# Patient Record
Sex: Female | Born: 1960
Health system: Southern US, Community
[De-identification: ages and names within clinical notes are randomized; demographics above are authoritative.]

## PROBLEM LIST (undated history)

## (undated) DIAGNOSIS — R87619 Unspecified abnormal cytological findings in specimens from cervix uteri: Secondary | ICD-10-CM

## (undated) DIAGNOSIS — A64 Unspecified sexually transmitted disease: Secondary | ICD-10-CM

## (undated) DIAGNOSIS — IMO0002 Reserved for concepts with insufficient information to code with codable children: Secondary | ICD-10-CM

## (undated) DIAGNOSIS — E039 Hypothyroidism, unspecified: Secondary | ICD-10-CM

## (undated) DIAGNOSIS — M1990 Unspecified osteoarthritis, unspecified site: Secondary | ICD-10-CM

## (undated) DIAGNOSIS — E785 Hyperlipidemia, unspecified: Secondary | ICD-10-CM

## (undated) HISTORY — DX: Unspecified abnormal cytological findings in specimens from cervix uteri: R87.619

## (undated) HISTORY — DX: Hyperlipidemia, unspecified: E78.5

## (undated) HISTORY — DX: Hypothyroidism, unspecified: E03.9

## (undated) HISTORY — DX: Unspecified sexually transmitted disease: A64

## (undated) HISTORY — DX: Reserved for concepts with insufficient information to code with codable children: IMO0002

## (undated) HISTORY — PX: COLONOSCOPY: SHX174

---

## 1998-01-13 ENCOUNTER — Encounter: Admission: RE | Admit: 1998-01-13 | Discharge: 1998-04-13 | Payer: Self-pay | Admitting: Family Medicine

## 2002-12-10 ENCOUNTER — Other Ambulatory Visit: Admission: RE | Admit: 2002-12-10 | Discharge: 2002-12-10 | Payer: Self-pay | Admitting: Obstetrics and Gynecology

## 2003-02-04 ENCOUNTER — Encounter: Payer: Self-pay | Admitting: Family Medicine

## 2003-02-04 ENCOUNTER — Ambulatory Visit (HOSPITAL_COMMUNITY): Admission: RE | Admit: 2003-02-04 | Discharge: 2003-02-04 | Payer: Self-pay | Admitting: Family Medicine

## 2003-02-13 ENCOUNTER — Ambulatory Visit (HOSPITAL_BASED_OUTPATIENT_CLINIC_OR_DEPARTMENT_OTHER): Admission: RE | Admit: 2003-02-13 | Discharge: 2003-02-13 | Payer: Self-pay | Admitting: Family Medicine

## 2003-06-10 ENCOUNTER — Encounter: Admission: RE | Admit: 2003-06-10 | Discharge: 2003-09-08 | Payer: Self-pay | Admitting: Family Medicine

## 2003-06-11 ENCOUNTER — Encounter: Admission: RE | Admit: 2003-06-11 | Discharge: 2003-06-11 | Payer: Self-pay | Admitting: General Surgery

## 2003-06-25 ENCOUNTER — Encounter: Admission: RE | Admit: 2003-06-25 | Discharge: 2003-06-25 | Payer: Self-pay | Admitting: General Surgery

## 2003-07-01 ENCOUNTER — Ambulatory Visit (HOSPITAL_COMMUNITY): Admission: RE | Admit: 2003-07-01 | Discharge: 2003-07-01 | Payer: Self-pay | Admitting: General Surgery

## 2003-07-01 ENCOUNTER — Encounter (INDEPENDENT_AMBULATORY_CARE_PROVIDER_SITE_OTHER): Payer: Self-pay | Admitting: Cardiology

## 2003-08-31 HISTORY — PX: GASTRIC BYPASS: SHX52

## 2003-09-16 ENCOUNTER — Inpatient Hospital Stay (HOSPITAL_COMMUNITY): Admission: RE | Admit: 2003-09-16 | Discharge: 2003-09-19 | Payer: Self-pay | Admitting: General Surgery

## 2003-09-22 ENCOUNTER — Encounter: Admission: RE | Admit: 2003-09-22 | Discharge: 2003-12-21 | Payer: Self-pay | Admitting: Family Medicine

## 2004-02-05 ENCOUNTER — Other Ambulatory Visit: Admission: RE | Admit: 2004-02-05 | Discharge: 2004-02-05 | Payer: Self-pay | Admitting: Family Medicine

## 2004-03-30 ENCOUNTER — Encounter: Admission: RE | Admit: 2004-03-30 | Discharge: 2004-03-30 | Payer: Self-pay | Admitting: Family Medicine

## 2004-06-07 ENCOUNTER — Other Ambulatory Visit: Admission: RE | Admit: 2004-06-07 | Discharge: 2004-06-07 | Payer: Self-pay | Admitting: Obstetrics and Gynecology

## 2004-08-02 ENCOUNTER — Encounter: Admission: RE | Admit: 2004-08-02 | Discharge: 2004-10-31 | Payer: Self-pay | Admitting: Family Medicine

## 2004-09-13 ENCOUNTER — Other Ambulatory Visit: Admission: RE | Admit: 2004-09-13 | Discharge: 2004-09-13 | Payer: Self-pay | Admitting: Obstetrics and Gynecology

## 2005-01-21 ENCOUNTER — Other Ambulatory Visit: Admission: RE | Admit: 2005-01-21 | Discharge: 2005-01-21 | Payer: Self-pay | Admitting: Obstetrics and Gynecology

## 2005-12-30 ENCOUNTER — Other Ambulatory Visit: Admission: RE | Admit: 2005-12-30 | Discharge: 2005-12-30 | Payer: Self-pay | Admitting: Obstetrics & Gynecology

## 2006-03-13 ENCOUNTER — Other Ambulatory Visit: Admission: RE | Admit: 2006-03-13 | Discharge: 2006-03-13 | Payer: Self-pay | Admitting: Obstetrics & Gynecology

## 2007-03-16 ENCOUNTER — Other Ambulatory Visit: Admission: RE | Admit: 2007-03-16 | Discharge: 2007-03-16 | Payer: Self-pay | Admitting: Obstetrics and Gynecology

## 2007-05-30 ENCOUNTER — Encounter: Admission: RE | Admit: 2007-05-30 | Discharge: 2007-05-30 | Payer: Self-pay | Admitting: Endocrinology

## 2007-06-06 ENCOUNTER — Encounter (INDEPENDENT_AMBULATORY_CARE_PROVIDER_SITE_OTHER): Payer: Self-pay | Admitting: Interventional Radiology

## 2007-06-06 ENCOUNTER — Other Ambulatory Visit: Admission: RE | Admit: 2007-06-06 | Discharge: 2007-06-06 | Payer: Self-pay | Admitting: Interventional Radiology

## 2007-06-06 ENCOUNTER — Encounter: Admission: RE | Admit: 2007-06-06 | Discharge: 2007-06-06 | Payer: Self-pay | Admitting: Endocrinology

## 2008-02-20 ENCOUNTER — Other Ambulatory Visit: Admission: RE | Admit: 2008-02-20 | Discharge: 2008-02-20 | Payer: Self-pay | Admitting: Obstetrics and Gynecology

## 2008-03-24 ENCOUNTER — Other Ambulatory Visit: Admission: RE | Admit: 2008-03-24 | Discharge: 2008-03-24 | Payer: Self-pay | Admitting: Obstetrics & Gynecology

## 2008-04-15 ENCOUNTER — Encounter: Admission: RE | Admit: 2008-04-15 | Discharge: 2008-04-15 | Payer: Self-pay | Admitting: Endocrinology

## 2009-12-06 IMAGING — US US SOFT TISSUE HEAD/NECK
1 series · 14 of 24 positions shown · non-contrast
Comparison: None.

THYROID ULTRASOUND:

CLINICAL DATA: Goiter on physical exam
TECHNIQUE: Ultrasound examination of the thyroid gland and adjacent soft tissue
structures was performed.

[Series 1: unknown · 0.09mm/px · 14 of 24 slices shown]
[im 1/24]
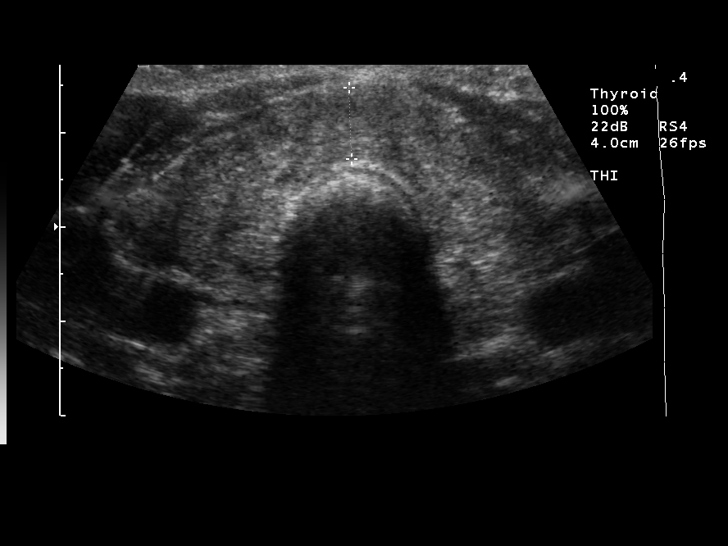
[im 3/24]
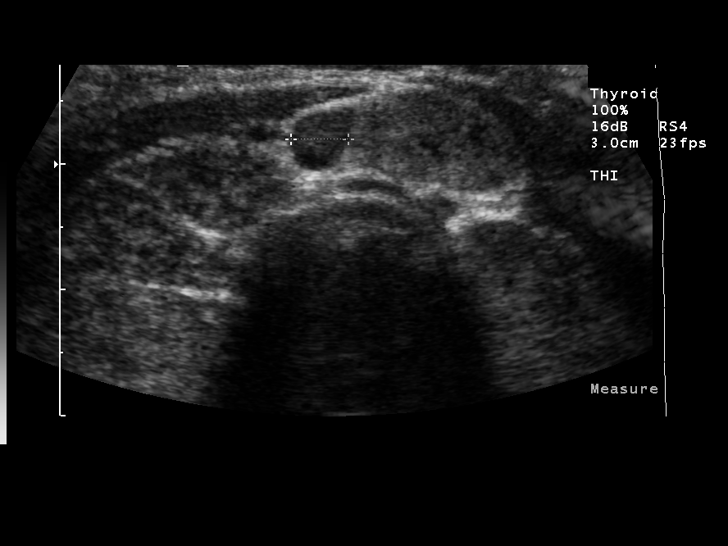
[im 5/24]
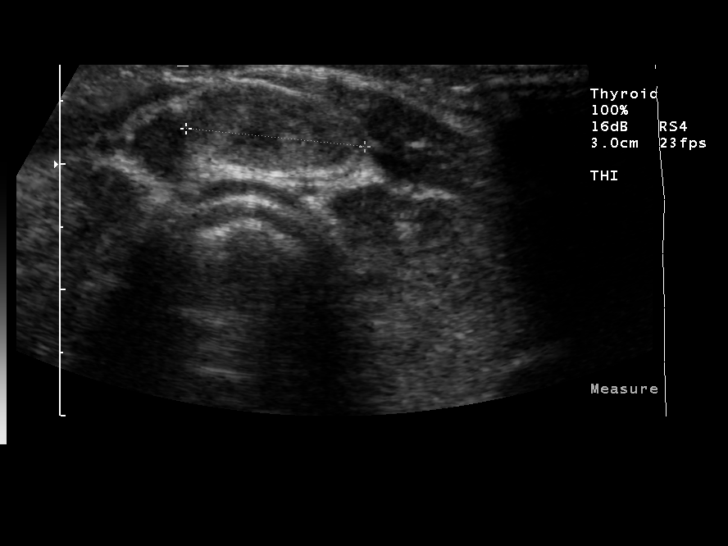
[im 7/24]
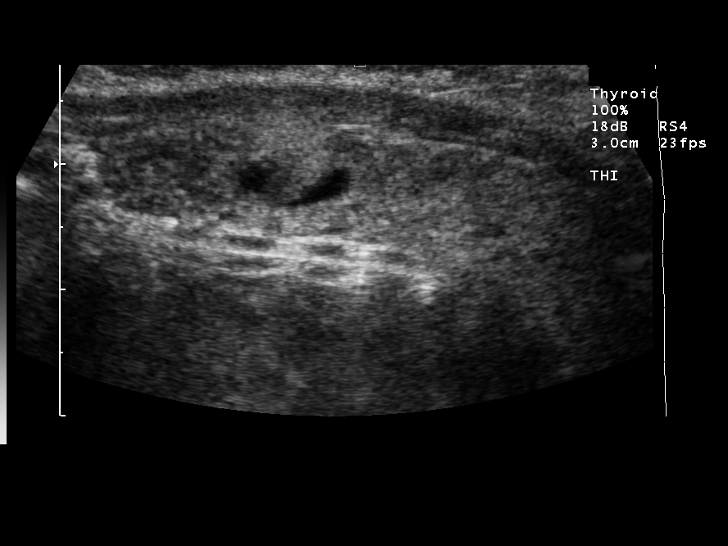
[im 8/24]
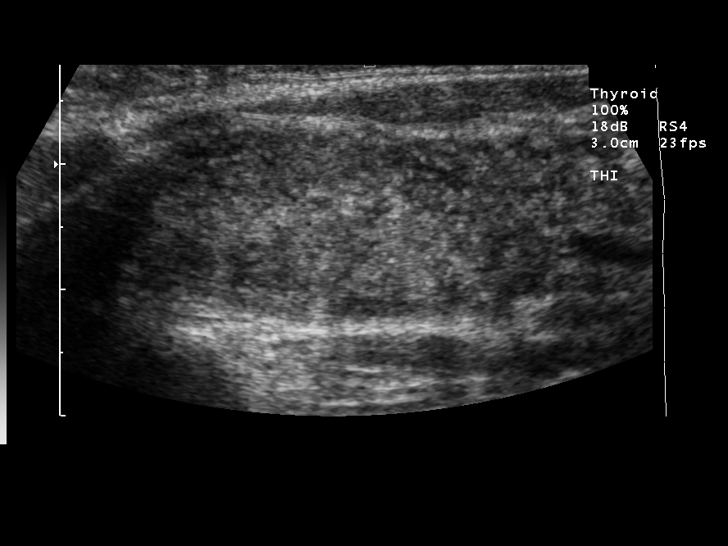
[im 10/24]
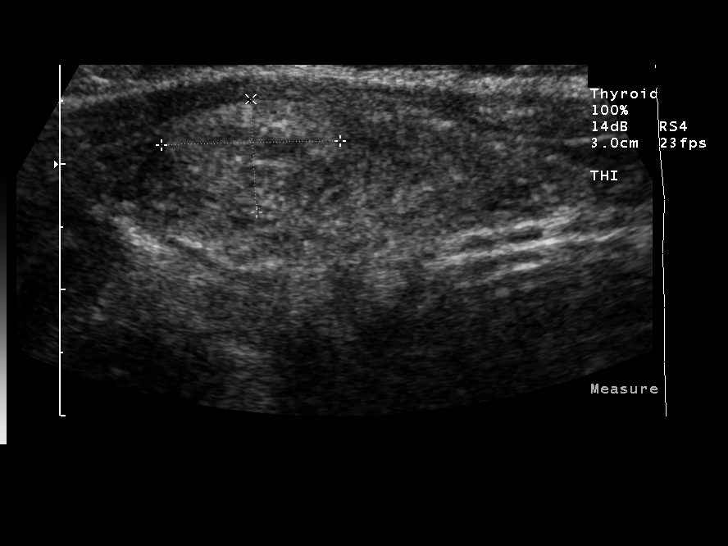
[im 12/24]
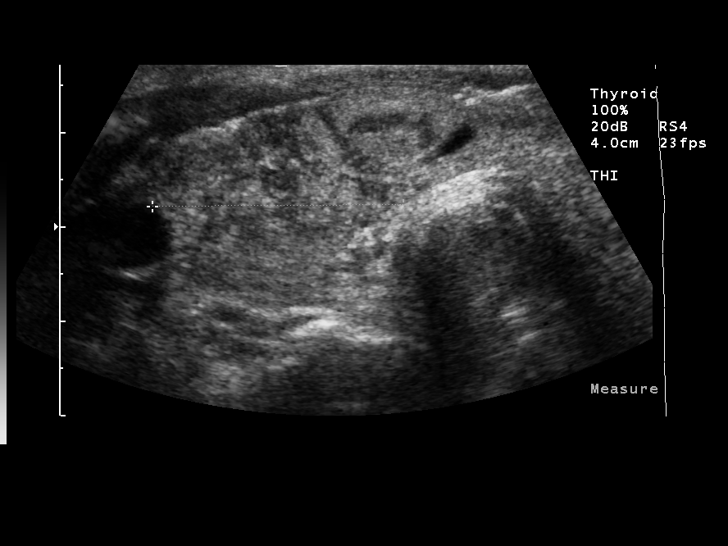
[im 13/24]
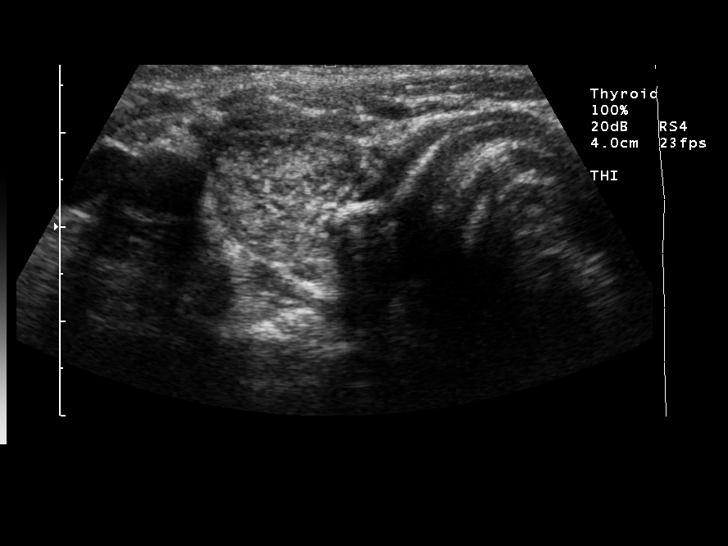
[im 15/24]
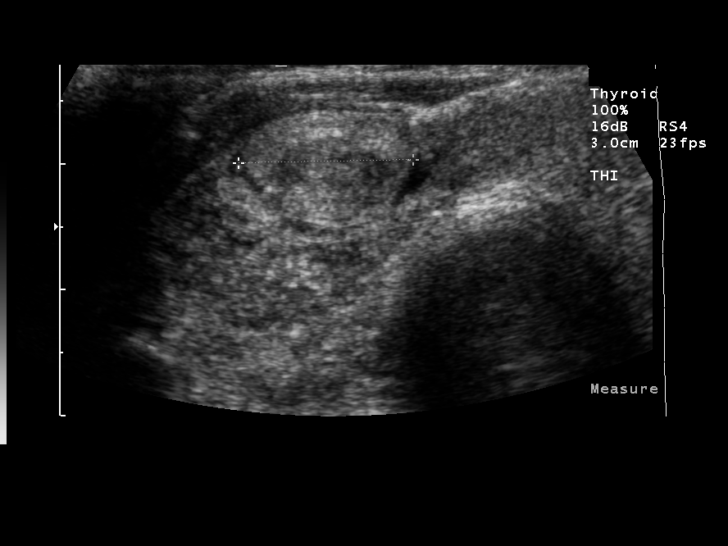
[im 17/24]
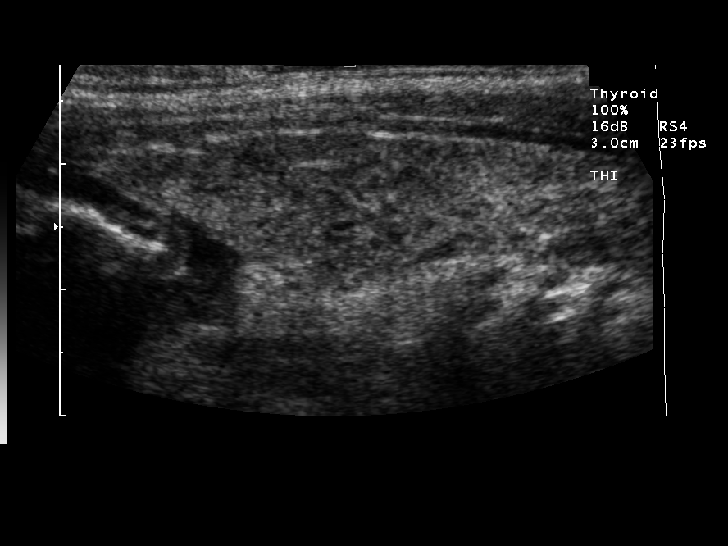
[im 19/24]
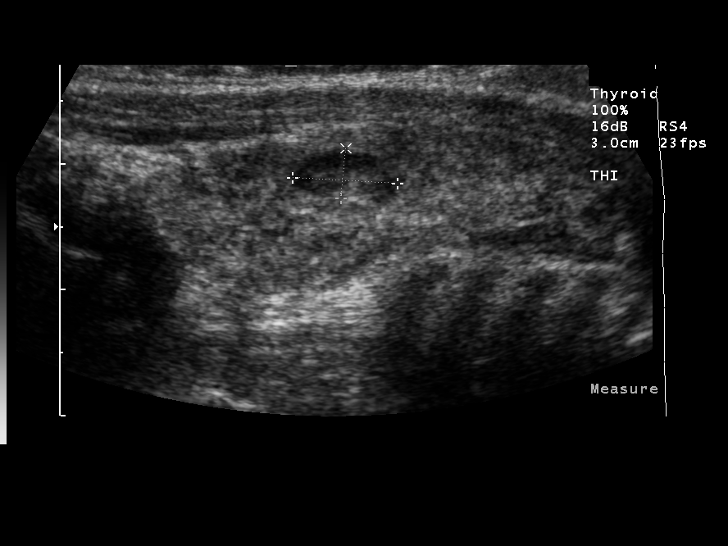
[im 20/24]
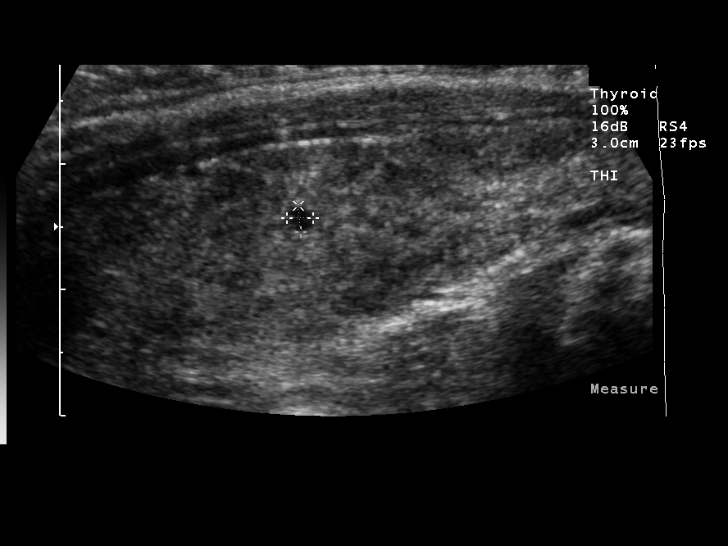
[im 22/24]
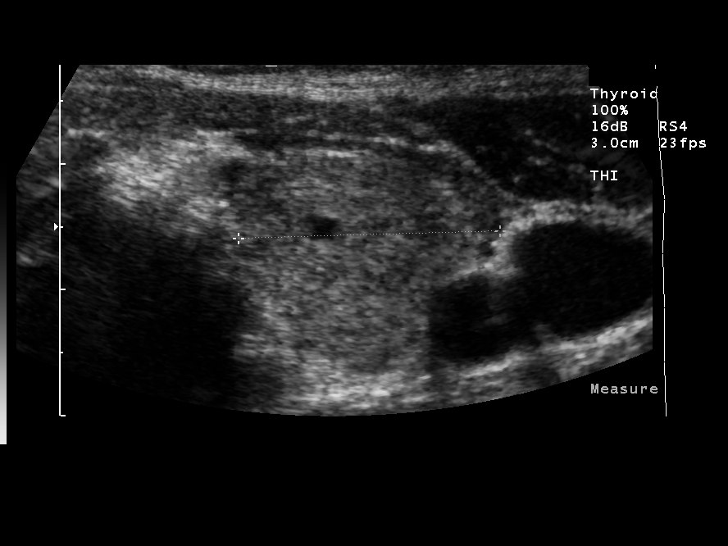
[im 24/24]
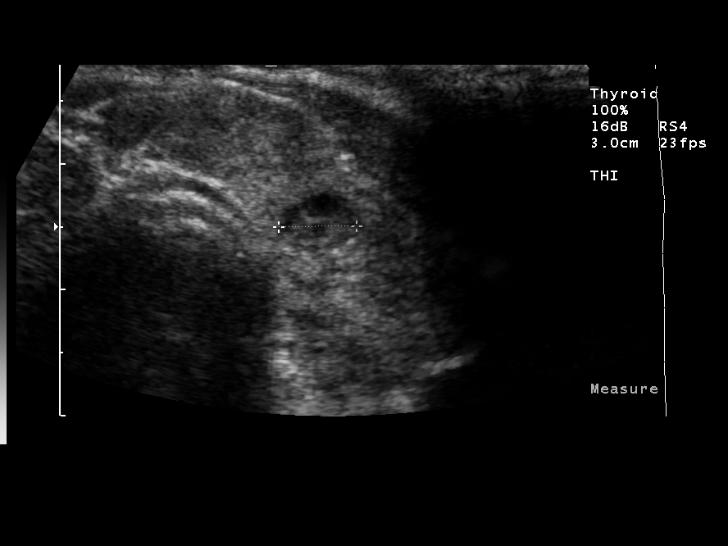

[14 of 24 positions shown; findings below may reference images not displayed]

FINDINGS: The right thyroid lobe measures 5.9 x 2.1 x 2.6 cm.  The left lobe
measures 5.0 x 1.6 x 2.0 cm.  The isthmus measures 7 mm in thickness.  Thyroid
echotexture is diffusely heterogeneous. Multiple thyroid nodules are evident.
Dominant nodule in the right thyroid gland is in the interpolar region, as
noncystic features, and measures up to 1.4 cm in diameter. There is a noncystic
dominant nodule in the left lobe measuring 1.1 cm. Other next cystic and solid
appearing nodules are seen bilaterally ranging in size from to up to 8 mm.
IMPRESSION: Heterogeneous thyroid echotexture with multiple thyroid nodules. Imaging
features are compatible with a multinodular goiter. Consider followup ultrasound
to ensure that the dominant lesions remain stable.

## 2010-09-17 NOTE — Op Note (Signed)
[name], [name]                       ACCOUNT NO.:  [id number]   MEDICAL RECORD NO.:  [id number]                   PATIENT TYPE:  INP   LOCATION:  0462                                 FACILITY:  [facility]   PHYSICIAN:  Sharlet Salina T. Hoxworth, M.D.          DATE OF BIRTH:  11-29-1960   DATE OF PROCEDURE:  09/16/2003  DATE OF DISCHARGE:                                 OPERATIVE REPORT   PREOPERATIVE DIAGNOSES:  1. Morbid obesity.  2. Joint pain.  3. Hypercholesterolemia.   POSTOPERATIVE DIAGNOSES:  1. Morbid obesity.  2. Joint pain.  3. Hypercholesterolemia.   SURGICAL PROCEDURE:  Laparoscopic Roux-Y gastric bypass.   SURGEON:  Dr. Johna Sheriff   ASSISTANT:  Dr. Danna Hefty   ANESTHESIA:  General.   BRIEF HISTORY:  [name] is a 50 year old white female with  essentially a life-long history of morbid obesity, unresponsive to medical  management.  Current weight is 312 pounds with a BMI of 52.  After an  extensive preoperative evaluation and work-up detailed elsewhere, we have  elected to proceed with laparoscopic Roux-Y gastric bypass for treatment of  her morbid obesity.  The procedure has been thoroughly discussed and risks  and outcomes detailed as documented elsewhere.  She is now brought to the  operating room for this procedure.   DESCRIPTION OF OPERATION:  The patient was brought to the operating room,  placed in supine position on the operating table, and general endotracheal  anesthesia was induced.  She had received a mechanical antibiotic bowel prep  and preoperative antibiotics and subcu Lovenox.  The abdomen was widely  sterilely prepped and draped.  Local anesthesia was used to infiltrate the  trocar sites prior to the incisions.  The abdomen was accessed with an 11 mm  trocar, using the OptiView technique in the left subcostal space  midclavicular line without difficulty and pneumoperitoneum established.  Under direct vision, an 11 mm trocar was  placed in the right upper quadrant  through the falciform ligament in the right mid abdomen in the left mid  abdomen medial to the initial trocar, and a 5 mm trocar placed in the left  flank, all under direct vision.  The omentum and transverse colon were  elevated and the ligament of Treitz clearly identified.  A 40 cm afferent  limb was then measured, at which point the mesentery was quite mobile and  the bowel reached up easily to the liver edge.  The bowel was divided at  this point with a firing of the EndoGIA vascular linear stapler.  The  mesentery was further divided with one further firing of the stapler and  slight division of the harmonic scalpel to allow mobility.  The end of the  efferent limb was marked by suturing a Penrose drain to this.  A 100 cm  efferent limb was then measured.  At this point, it was noted that the very  tip of the afferent  limb looked somewhat dusty, and the mesentery over about  1 cm of this was divided with the harmonic scalpel, and this was resected  with a single firing of the EndoGIA vascular stapler back to healthy-  appearing bowel and the small portion of resected bowel removed with an  EndoCatch bag.  Following this, a side-by-side anastomosis was created  between the afferent and efferent limb, creating enterotomies with the  harmonic scalpel and using a single firing of the EndoGIA vascular stapler.  The common enterotomy was then closed with running 2-0 Vicryl, beginning at  either end and tying centrally.  Following this, the mesenteric defect  beneath the afferent limb was exposed, and this was closed with a running 2-  0 silk suture which was carried out onto the jejunojejunostomy, completely  closing this defect.  Tisseal tissue sealant was then placed over the staple  and suture lines of the jejunojejunostomy.  At this point, the patient was  placed in reverse Trendelenburg position.  Through a 5 mm trocar site, the  Nathanson retractor  was placed and the left lobe of the liver elevated with  good exposure.  Angle His was exposed, and the peritoneum here was incised,  the angle of His/ mobilized back down toward the left crus.  Following this,  a site along the lesser curve was identified for creation of the pouch about  4-5 cm from the EG junction.  The mesentery along lesser curve was incised  with the harmonic scalpel, and the posterior gastric wall was carefully  dissected and the lesser sac entered without difficulty.  With all tubes  removed from the stomach, an initial firing of the EndoGIA 3.5 mm stapler  was performed at right angles to the lesser curve.  A tubular pouch was then  created up toward the angle of His with three more firings of the EndoGIA  3.5 mm stapler, dissecting the posterior pouch as we went and seen that it  was completely divided from the gastric remnant.  The last couple of firings  were done with the Ewald tube in the pouch to prevent any possible narrowing  of the EG junction.  After this, the efferent limb was brought up to the  pouch and reached easily without any significant tension.  An initial  posterior row of 2-0 Vicryl was placed between the jejunum and gastric pouch  along the staple line.  Ewald tube was then advanced to the distal pouch,  and enterotomies were made in the pouch and the jejunum with the harmonic  scalpel.  EndoGIA 3.5 mm stapler and a linear anastomosis created about 2 cm  in length.  The anastomosis was inspected.  The staple line was intact  without bleeding.  The common enterotomy was then closed with running 2-0  Vicryl, begun at either end of the defect and tied centrally.  The Ewald  tube was then passed through the anastomosis and an outer seromuscular  suture of 2-0 Vicryl was used on top of this.  Following this, the efferent limb was clamped near the anastomosis, the patient placed flat and with the  gastrojejunostomy under saline, the patient was  endoscoped with distention  of the pouch.  There was no evidence of any leak.  All air was suctioned.  Saline was suctioned and hemostasis assured.  Tisseal was placed over the  suture and staple lines of the gastrojejunostomy.  A closed suction drain  was brought out through the lateral trocar site and  left in the subhepatic  space near the anastomosis.  Trocars were then removed and skin incisions  closed with staples.  Sponge, needle, and instrument counts were correct.  Dry sterile dressings were applied and the patient taken to recovery in good  condition.                                               Lorne Skeens. Hoxworth, M.D.    Tory Emerald  D:  09/16/2003  T:  09/16/2003  Job:  [id number]

## 2010-09-17 NOTE — Discharge Summary (Signed)
[name], [name]                       ACCOUNT NO.:  [id number]   MEDICAL RECORD NO.:  [id number]                   PATIENT TYPE:  INP   LOCATION:  0462                                 FACILITY:  [facility]   PHYSICIAN:  Sharlet Salina T. Hoxworth, M.D.          DATE OF BIRTH:  Dec 23, 1960   DATE OF ADMISSION:  09/16/2003  DATE OF DISCHARGE:  09/19/2003                                 DISCHARGE SUMMARY   DISCHARGE DIAGNOSES:  1. Morbid obesity.  2. Hypercholesterolemia.   OPERATIVE PROCEDURE:  Lap Roux-Y gastric bypass, August 17, 2003.   HISTORY OF PRESENT ILLNESS:  [name] is a 50 year old white female with a  lifelong history of progressive morbid obesity, now with a BMI of 51  weighing 308 pounds.  She has had failure of nonsurgical weight loss.  Following a thorough preoperative discussion work-up detailed elsewhere, she  is admitted for a laparoscopic Roux-Y gastric bypass.   PAST MEDICAL HISTORY:  1. Surgical is significant for C-section.  2. She is treated for hypercholesterolemia and hypothyroidism.   MEDICATIONS:  1. Thyroxine 100 mcg daily.  2. Lipitor 40 daily.   ALLERGIES:  No allergies.   Social history, family history, review of systems, see detailed H&P.   PERTINENT PHYSICAL EXAMINATION:  Significant only for morbid obesity.   HOSPITAL COURSE:  The patient was admitted on the morning of her procedure  and underwent uneventful laparoscopic Roux-Y gastric bypass.  Her  postoperative course was uncomplicated.  Gastrografin swallow on the first  postoperative day was negative for leak or obstruction.  She was started on  a clear liquid diet and able to be advanced over the next 48 hours to a full  liquid diet which she tolerated well.  White count was normal.  She was  discharged home on Sep 19, 2003.  Abdomen was soft and nontender.   DISCHARGE MEDICATIONS:  Same as mentioned plus Roxicet elixir for pain.   Follow up is to be in my office in 1 week.                                            Lorne Skeens. Hoxworth, M.D.   Tory Emerald  D:  10/19/2003  T:  10/20/2003  Job:  [id number]

## 2010-09-17 NOTE — Op Note (Signed)
[name], [name]                       ACCOUNT NO.:  [id number]   MEDICAL RECORD NO.:  [id number]                   PATIENT TYPE:  INP   LOCATION:  0462                                 FACILITY:  [facility]   PHYSICIAN:  Vikki Ports, M.D.         DATE OF BIRTH:  [birth year]   DATE OF PROCEDURE:  09/16/2003  DATE OF DISCHARGE:                                 OPERATIVE REPORT   PREOPERATIVE DIAGNOSIS:  Status post laparoscopic Roux-en-Y gastric bypass.   POSTOPERATIVE DIAGNOSIS:  Status post laparoscopic Roux-en-Y gastric bypass,  no leak.  Patient anastomosis.   OPERATION/PROCEDURE:  Upper endoscopy.   SURGEON:  Vikki Ports, M.D.   DESCRIPTION OF PROCEDURE:  At the end of the laparoscopic Roux-en-Y gastric  bypass performed by Dr. Jaclynn Guarneri, I introduced the Olympus endoscope  into the oropharynx.  At the 38 cm, the Z-line was visualized as was the  gastric pouch.  It was distended under intraperitoneal saline.  There was no  evidence of leak.  Anastomosis was widely patent.  Pouch and small bowel  were decompressed.  Scope was removed.  The remainder of the dictation will  be dictated by Dr. Johna Sheriff.                                               Vikki Ports, M.D.    KRH/MEDQ  D:  09/16/2003  T:  09/16/2003  Job:  [id number]

## 2011-06-09 LAB — HM PAP SMEAR: HM Pap smear: NEGATIVE

## 2012-07-27 ENCOUNTER — Telehealth: Payer: Self-pay | Admitting: Nurse Practitioner

## 2012-07-27 MED ORDER — VALACYCLOVIR HCL 1 G PO TABS: ORAL_TABLET | ORAL | Status: DC

## 2012-07-27 NOTE — Telephone Encounter (Signed)
Patient says she takes the generic valtrex for outbreaks & that she had an outbreak 2 wks ago so she wants a rx on hand incase she gets another one

## 2012-07-27 NOTE — Telephone Encounter (Signed)
PT IS REQUESTING.PRES FOR GENERIC VALTREX 1 GRAM SENT TO [facility]  [phone]  [phone]

## 2012-07-27 NOTE — Telephone Encounter (Signed)
Ok to refill generic Valtrex 1 gm # 30/ no RF

## 2012-07-27 NOTE — Telephone Encounter (Signed)
07/27/12 @ 2:59pm left message for callback

## 2012-07-27 NOTE — Telephone Encounter (Signed)
07/27/12 rx sent for valacyclovir

## 2012-07-27 NOTE — Telephone Encounter (Signed)
Pt has hx of hsv but no rx given at [facility] 06/13/12. Please advise

## 2012-07-27 NOTE — Telephone Encounter (Signed)
Please confirm with patient whether she wants rx for an outbreak and needs rx or wants rx for suppresion, affects the amount given

## 2013-04-29 ENCOUNTER — Encounter: Payer: Self-pay | Admitting: Nurse Practitioner

## 2013-04-29 ENCOUNTER — Ambulatory Visit (INDEPENDENT_AMBULATORY_CARE_PROVIDER_SITE_OTHER): Payer: 59 | Admitting: Nurse Practitioner

## 2013-04-29 VITALS — BP 122/76 | HR 72 | Ht 65.0 in | Wt 257.0 lb

## 2013-04-29 DIAGNOSIS — N898 Other specified noninflammatory disorders of vagina: Secondary | ICD-10-CM

## 2013-04-29 DIAGNOSIS — N899 Noninflammatory disorder of vagina, unspecified: Secondary | ICD-10-CM

## 2013-04-29 DIAGNOSIS — B373 Candidiasis of vulva and vagina: Secondary | ICD-10-CM

## 2013-04-29 MED ORDER — FLUCONAZOLE 150 MG PO TABS: 150.0000 mg | ORAL_TABLET | Freq: Once | ORAL | Status: DC

## 2013-04-29 NOTE — Progress Notes (Signed)
Subjective:     Patient ID: [name], female   DOB: 1960/05/14, 52 y.o.   MRN: [id number]  HPI This 52 yo Fe complaints of vaginal discharge since 12/25.  These symptoms seem to correlate with sexual activity 3 days prior.  She has a sexual partner and they see each other 1 X yearly.  Husband has some health issues and they are not sexually active.  Some vaginal irritation but only mild itching. No vaginal odor.  Usually has to use lubrication for SA.  Unknown if partner is monogamous.  Denies any urinary symptoms.  Review of Systems  Constitutional: Negative for fever and chills.  Respiratory: Negative.   Cardiovascular: Negative.   Gastrointestinal: Negative.  Negative for nausea, vomiting, abdominal pain, diarrhea and constipation.  Genitourinary: Positive for vaginal discharge. Negative for dysuria, urgency, frequency, hematuria, flank pain, enuresis, genital sores, pelvic pain and dyspareunia.  Musculoskeletal: Negative.   Skin: Negative.   Neurological: Negative.   Psychiatric/Behavioral: Negative.        Objective:   Physical Exam  Constitutional: She appears well-developed and well-nourished. She appears distressed.  Pulmonary/Chest: Effort normal.  Abdominal: Soft. She exhibits no distension. There is no tenderness. There is no rebound and no guarding.  Genitourinary:  No external lesions. Thin white vaginal discharge, no cervicitis.  IUD string is visible. No other lesions. No pain at the urethra or baldder on bimanual exam.  Wet Prep: PH: 5; NS - clue cells; KOH: + yeast.         Assessment:     Yeast vaginitis R/O STD's    Plan:     Diflucan 150 mg X 2 Will follow with results of GC/ CHL Will see her back in the spring and do rest of STD's

## 2013-04-29 NOTE — Patient Instructions (Signed)
Monilial Vaginitis  Vaginitis in a soreness, swelling and redness (inflammation) of the vagina and vulva. Monilial vaginitis is not a sexually transmitted infection.  CAUSES   Yeast vaginitis is caused by yeast (candida) that is normally found in your vagina. With a yeast infection, the candida has overgrown in number to a point that upsets the chemical balance.  SYMPTOMS   · White, thick vaginal discharge.  · Swelling, itching, redness and irritation of the vagina and possibly the lips of the vagina (vulva).  · Burning or painful urination.  · Painful intercourse.  DIAGNOSIS   Things that may contribute to monilial vaginitis are:  · Postmenopausal and virginal states.  · Pregnancy.  · Infections.  · Being tired, sick or stressed, especially if you had monilial vaginitis in the past.  · Diabetes. Good control will help lower the chance.  · Birth control pills.  · Tight fitting garments.  · Using bubble bath, feminine sprays, douches or deodorant tampons.  · Taking certain medications that kill germs (antibiotics).  · Sporadic recurrence can occur if you become ill.  TREATMENT   Your caregiver will give you medication.  · There are several kinds of anti monilial vaginal creams and suppositories specific for monilial vaginitis. For recurrent yeast infections, use a suppository or cream in the vagina 2 times a week, or as directed.  · Anti-monilial or steroid cream for the itching or irritation of the vulva may also be used. Get your caregiver's permission.  · Painting the vagina with methylene blue solution may help if the monilial cream does not work.  · Eating yogurt may help prevent monilial vaginitis.  HOME CARE INSTRUCTIONS   · Finish all medication as prescribed.  · Do not have sex until treatment is completed or after your caregiver tells you it is okay.  · Take warm sitz baths.  · Do not douche.  · Do not use tampons, especially scented ones.  · Wear cotton underwear.  · Avoid tight pants and panty  hose.  · Tell your sexual partner that you have a yeast infection. They should go to their caregiver if they have symptoms such as mild rash or itching.  · Your sexual partner should be treated as well if your infection is difficult to eliminate.  · Practice safer sex. Use condoms.  · Some vaginal medications cause latex condoms to fail. Vaginal medications that harm condoms are:  · Cleocin cream.  · Butoconazole (Femstat®).  · Terconazole (Terazol®) vaginal suppository.  · Miconazole (Monistat®) (may be purchased over the counter).  SEEK MEDICAL CARE IF:   · You have a temperature by mouth above 102° F (38.9° C).  · The infection is getting worse after 2 days of treatment.  · The infection is not getting better after 3 days of treatment.  · You develop blisters in or around your vagina.  · You develop vaginal bleeding, and it is not your menstrual period.  · You have pain when you urinate.  · You develop intestinal problems.  · You have pain with sexual intercourse.  Document Released: 01/26/2005 Document Revised: 07/11/2011 Document Reviewed: 10/10/2008  ExitCare® Patient Information ©2014 [employer].

## 2013-04-30 NOTE — Progress Notes (Signed)
Encounter reviewed by Dr. Brook Silva.  

## 2013-06-05 ENCOUNTER — Encounter: Payer: Self-pay | Admitting: Obstetrics and Gynecology

## 2013-06-19 ENCOUNTER — Ambulatory Visit: Payer: Self-pay | Admitting: Obstetrics and Gynecology

## 2013-07-08 ENCOUNTER — Ambulatory Visit: Payer: Self-pay | Admitting: Obstetrics and Gynecology

## 2013-07-11 ENCOUNTER — Other Ambulatory Visit: Payer: Self-pay | Admitting: Obstetrics & Gynecology

## 2013-07-11 ENCOUNTER — Ambulatory Visit: Payer: Self-pay | Admitting: Obstetrics & Gynecology

## 2013-07-11 ENCOUNTER — Ambulatory Visit (INDEPENDENT_AMBULATORY_CARE_PROVIDER_SITE_OTHER): Payer: 59 | Admitting: Obstetrics & Gynecology

## 2013-07-11 ENCOUNTER — Encounter: Payer: Self-pay | Admitting: Obstetrics & Gynecology

## 2013-07-11 VITALS — BP 116/62 | HR 64 | Resp 20 | Ht 65.0 in | Wt 261.0 lb

## 2013-07-11 DIAGNOSIS — Z0000 Encounter for general adult medical examination without abnormal findings: Secondary | ICD-10-CM

## 2013-07-11 DIAGNOSIS — Z9884 Bariatric surgery status: Secondary | ICD-10-CM

## 2013-07-11 DIAGNOSIS — Z01419 Encounter for gynecological examination (general) (routine) without abnormal findings: Secondary | ICD-10-CM

## 2013-07-11 LAB — COMPREHENSIVE METABOLIC PANEL
ALK PHOS: 79 U/L (ref 39–117)
ALT: 26 U/L (ref 0–35)
AST: 26 U/L (ref 0–37)
Albumin: 3.9 g/dL (ref 3.5–5.2)
BILIRUBIN TOTAL: 0.5 mg/dL (ref 0.2–1.2)
BUN: 17 mg/dL (ref 6–23)
CO2: 27 mEq/L (ref 19–32)
CREATININE: 0.75 mg/dL (ref 0.50–1.10)
Calcium: 8.8 mg/dL (ref 8.4–10.5)
Chloride: 104 mEq/L (ref 96–112)
GLUCOSE: 91 mg/dL (ref 70–99)
Potassium: 3.9 mEq/L (ref 3.5–5.3)
SODIUM: 138 meq/L (ref 135–145)
TOTAL PROTEIN: 6.5 g/dL (ref 6.0–8.3)

## 2013-07-11 LAB — POCT URINALYSIS DIPSTICK
BILIRUBIN UA: NEGATIVE
Blood, UA: NEGATIVE
GLUCOSE UA: NEGATIVE
Ketones, UA: NEGATIVE
LEUKOCYTES UA: NEGATIVE
NITRITE UA: NEGATIVE
PH UA: 5
Protein, UA: NEGATIVE
Urobilinogen, UA: NEGATIVE

## 2013-07-11 LAB — LIPID PANEL
CHOL/HDL RATIO: 2.1 ratio
Cholesterol: 193 mg/dL (ref 0–200)
HDL: 92 mg/dL (ref >39)
LDL CALC: 92 mg/dL (ref 0–99)
TRIGLYCERIDES: 45 mg/dL (ref <150)
VLDL: 9 mg/dL (ref 0–40)

## 2013-07-11 LAB — TSH: TSH: 2.747 u[IU]/mL (ref 0.350–4.500)

## 2013-07-11 MED ORDER — CLOBETASOL PROPIONATE 0.05 % EX OINT: 1.0000 "application " | TOPICAL_OINTMENT | Freq: Two times a day (BID) | CUTANEOUS | Status: DC

## 2013-07-11 NOTE — Addendum Note (Signed)
Addended by: Jerene BearsMILLER, Sarabella Caprio S on: 07/11/2013 08:48 PM   Modules accepted: Orders

## 2013-07-11 NOTE — Patient Instructions (Addendum)

## 2013-07-11 NOTE — Progress Notes (Signed)
53 y.o. [id number] MarriedCaucasianF here for annual exam.  No vaginal bleeding since 2008.  No trouble with hot flashes.  Sister, age 51, with recent breast cancer.  Bilateral mastectomy with reconstruction.  Sister is a Therapist, sportspsychiatrist.  Genetic testing is negative.  Patient's last menstrual period was 05/02/2006.          Sexually active: yes  The current method of family planning is IUD.    Exercising: yes  walking Smoker:  no  Health Maintenance: Pap:  06/09/11 WNL/negative HR HPV History of abnormal Pap:  yes MMG:  01/11/13 3D-normal Colonoscopy:  3/14-repeat in 10 years BMD:   2005 TDaP:  Will check with Dr Cliffton AstersWhite Screening Labs: today, Hb today: 13.2, Urine today: negative   reports that she has never smoked. She has never used smokeless tobacco. She reports that she does not drink alcohol or use illicit drugs.  Past Medical History  Diagnosis Date  . STD (sexually transmitted disease)     HSV  . Thyroid disease     hypothyroid  . Hyperlipidemia   . Abnormal Pap smear     gland. cells -> colpo -> neg endo bx, CIN I    Past Surgical History  Procedure Laterality Date  . Gastric bypass  5.2005  . Colposcopy      CIN I, neg endo bx   . Cesarean section      Current Outpatient Prescriptions  Medication Sig Dispense Refill  . atorvastatin (LIPITOR) 20 MG tablet Take 20 mg by mouth daily.      . Biotin 1000 MCG tablet Take 1,000 mcg by mouth daily.      . Calcium Carbonate (CALCIUM 600 PO) Take by mouth daily.      . Lactobacillus (ACIDOPHILUS PO) Take by mouth daily.      Marland Kitchen. levothyroxine (SYNTHROID, LEVOTHROID) 100 MCG tablet Take 100 mcg by mouth daily before breakfast. 200mcg daily, 100mcg on Sunday      . Multiple Vitamins-Minerals (MULTIVITAMIN PO) Take by mouth daily.      . NON FORMULARY OTC all natural leg cramp medication  2 qhs      . PARAGARD INTRAUTERINE COPPER IU by Intrauterine route.      . valACYclovir (VALTREX) 1000 MG tablet Take 1/2 gram bid x 3 days out  onset of outbreak  30 tablet  0  . VITAMIN D, CHOLECALCIFEROL, PO Take 4,000 Int'l Units by mouth daily.       No current facility-administered medications for this visit.    Family History  Problem Relation Age of Onset  . Osteoporosis Mother   . Lung cancer Mother     non small cell   . Deep vein thrombosis Mother   . Thyroid disease Sister     hypo  . Cancer Maternal Grandmother     ovarian cancer  . Diabetes Maternal Grandfather   . Heart attack Paternal Grandfather   . Breast cancer Sister 48    ROS:  Pertinent items are noted in HPI.  Otherwise, a comprehensive ROS was negative.  Exam:   BP 116/62  Pulse 64  Resp 20  Ht 5\' 5"  (1.651 m)  Wt 261 lb (118.389 kg)  BMI 43.43 kg/m2  LMP 05/02/2006  Weight change: @WEIGHTCHANGE @ Height:   Height: 5\' 5"  (165.1 cm)  Ht Readings from Last 3 Encounters:  07/11/13 5\' 5"  (1.651 m)  04/29/13 5\' 5"  (1.651 m)    General appearance: alert, cooperative and appears stated age Head: Normocephalic, without obvious  abnormality, atraumatic Neck: no adenopathy, supple, symmetrical, trachea midline and thyroid normal to inspection and palpation Lungs: clear to auscultation bilaterally Breasts: normal appearance, no masses or tenderness Heart: regular rate and rhythm Abdomen: soft, non-tender; bowel sounds normal; no masses,  no organomegaly Extremities: extremities normal, atraumatic, no cyanosis or edema Skin: Skin color, texture, turgor normal. No rashes or lesions Lymph nodes: Cervical, supraclavicular, and axillary nodes normal. No abnormal inguinal nodes palpated Neurologic: Grossly normal   Pelvic: External genitalia:  Erythema noted externally from vaginal down to rectum c/w topical irritation              Urethra:  normal appearing urethra with no masses, tenderness or lesions              Bartholins and Skenes: normal                 Vagina: normal appearing vagina with normal color and discharge, no lesions               Cervix: no lesions and IUD string noted              Pap taken: no Bimanual Exam:  Uterus:  normal size, contour, position, consistency, mobility, non-tender              Adnexa: normal adnexa and no mass, fullness, tenderness               Rectovaginal: Confirms               Anus:  normal sphincter tone, no lesions  A:  Well Woman with normal exam PMP, no HRT Obesity Hypothyroidism Recurrent HSV Sister with breast cancer, diagnosed age 58 Topical skin irritation   P:   Mammogram yearly pap smear with neg HR HPV 2/13.  No Pap today. BMD with MMG 9/15.  Order to be placed. Pt to check with PCP regarding TSH Lipids, CMP, TSH today Clobetasol ointment bid for 14 days.  Pt to call if not fully resolved. return annually or prn  An After Visit Summary was printed and given to the patient.

## 2013-07-12 LAB — CBC WITH DIFFERENTIAL/PLATELET
BASOS PCT: 1 % (ref 0–1)
Basophils Absolute: 0.1 10*3/uL (ref 0.0–0.1)
EOS ABS: 0.1 10*3/uL (ref 0.0–0.7)
Eosinophils Relative: 2 % (ref 0–5)
HCT: 40.5 % (ref 36.0–46.0)
HEMOGLOBIN: 13 g/dL (ref 12.0–15.0)
Lymphocytes Relative: 30 % (ref 12–46)
Lymphs Abs: 1.5 10*3/uL (ref 0.7–4.0)
MCH: 29.3 pg (ref 26.0–34.0)
MCHC: 32.1 g/dL (ref 30.0–36.0)
MCV: 91.4 fL (ref 78.0–100.0)
MONOS PCT: 8 % (ref 3–12)
Monocytes Absolute: 0.4 10*3/uL (ref 0.1–1.0)
NEUTROS ABS: 3 10*3/uL (ref 1.7–7.7)
NEUTROS PCT: 59 % (ref 43–77)
PLATELETS: 248 10*3/uL (ref 150–400)
RBC: 4.43 MIL/uL (ref 3.87–5.11)
RDW: 14.8 % (ref 11.5–15.5)
WBC: 5 10*3/uL (ref 4.0–10.5)

## 2013-07-12 LAB — HEMOGLOBIN, FINGERSTICK: Hemoglobin, fingerstick: 13.2 g/dL (ref 12.0–16.0)

## 2013-07-12 LAB — FERRITIN: FERRITIN: 88 ng/mL (ref 10–291)

## 2013-07-12 LAB — VITAMIN B12: Vitamin B-12: 1273 pg/mL — ABNORMAL HIGH (ref 211–911)

## 2013-07-12 LAB — IRON: Iron: 61 ug/dL (ref 42–145)

## 2013-07-13 LAB — VITAMIN D 25 HYDROXY (VIT D DEFICIENCY, FRACTURES): VIT D 25 HYDROXY: 46 ng/mL (ref 30–89)

## 2013-07-15 ENCOUNTER — Telehealth: Payer: Self-pay | Admitting: Obstetrics & Gynecology

## 2013-07-15 MED ORDER — VALACYCLOVIR HCL 500 MG PO TABS: ORAL_TABLET | ORAL | Status: DC

## 2013-07-15 NOTE — Telephone Encounter (Signed)
[facility] [address]. Valtrex was not called to her pharmacy at her last appointment. Patient did not pick up the "cream" that Dr.Miller prescribed. Patient said the cream was $200.00, she thinks she does not need the cream. Please call in Valtrex.

## 2013-07-15 NOTE — Telephone Encounter (Signed)
Last AEX 07/11/13 Last Valtrex refill 07/27/12 #30/0 refills  Please advise.

## 2013-07-16 NOTE — Telephone Encounter (Addendum)
LM for pt re: rx sent to pharmacy.  

## 2014-02-24 ENCOUNTER — Encounter: Payer: Self-pay | Admitting: Nurse Practitioner

## 2014-02-24 NOTE — Telephone Encounter (Signed)
Result received from [facility], scanned from 01-27-14. Ordered by PCP, Dr Laurann Montanaynthia White. T score -0.9 left hip.

## 2014-03-03 ENCOUNTER — Encounter: Payer: Self-pay | Admitting: Obstetrics & Gynecology

## 2014-07-17 ENCOUNTER — Encounter: Payer: Self-pay | Admitting: Obstetrics & Gynecology

## 2014-07-17 ENCOUNTER — Ambulatory Visit (INDEPENDENT_AMBULATORY_CARE_PROVIDER_SITE_OTHER): Payer: 59 | Admitting: Obstetrics & Gynecology

## 2014-07-17 VITALS — BP 102/62 | HR 64 | Resp 12 | Ht 65.0 in | Wt 288.0 lb

## 2014-07-17 DIAGNOSIS — Z01419 Encounter for gynecological examination (general) (routine) without abnormal findings: Secondary | ICD-10-CM

## 2014-07-17 DIAGNOSIS — E049 Nontoxic goiter, unspecified: Secondary | ICD-10-CM | POA: Diagnosis not present

## 2014-07-17 DIAGNOSIS — Z124 Encounter for screening for malignant neoplasm of cervix: Secondary | ICD-10-CM | POA: Diagnosis not present

## 2014-07-17 DIAGNOSIS — Z30432 Encounter for removal of intrauterine contraceptive device: Secondary | ICD-10-CM

## 2014-07-17 DIAGNOSIS — E010 Iodine-deficiency related diffuse (endemic) goiter: Secondary | ICD-10-CM

## 2014-07-17 NOTE — Addendum Note (Signed)
Addended by: Joeseph AmorFAST, Lynette Topete L on: 07/17/2014 09:41 AM   Modules accepted: Orders

## 2014-07-17 NOTE — Progress Notes (Signed)
Patient is scheduled for [facility] at [facility] at [address], [place] [id number] for 07/24/14 at 0955.

## 2014-07-17 NOTE — Progress Notes (Addendum)
54 y.o. [id number] MarriedCaucasianF here for annual exam.  It's been a good year for pt.  No vaginal bleeding.   PCP:  Dr. Cliffton AstersWhite.  She does all pt's blood work.     Patient's last menstrual period was 05/02/2004.          Sexually active: Yes.    The current method of family planning is IUD.    Exercising: Yes.    walking Smoker:  no  Health Maintenance: Pap:  06/09/11 WNL/negative HR HPV History of abnormal Pap:  no MMG:  01/16/14 3D-normal Colonoscopy:  2014-repeat in 10 years BMD:   01/16/14-normal TDaP:  PCP Screening Labs: PCP, Hb today: PCP, Urine today: PCP   reports that she has never smoked. She has never used smokeless tobacco. She reports that she does not drink alcohol or use illicit drugs.  Past Medical History  Diagnosis Date  . STD (sexually transmitted disease)     HSV  . Thyroid disease     hypothyroid  . Hyperlipidemia   . Abnormal Pap smear     gland. cells -> colpo -> neg endo bx, CIN I    Past Surgical History  Procedure Laterality Date  . Gastric bypass  5.2005  . Colposcopy      CIN I, neg endo bx   . Cesarean section    . Biopsy thyroid      Dr Talmage NapBalan 5-6 years ago-goiter on meds    Current Outpatient Prescriptions  Medication Sig Dispense Refill  . atorvastatin (LIPITOR) 20 MG tablet Take 20 mg by mouth daily.    . Biotin 1000 MCG tablet Take 1,000 mcg by mouth daily.    . Calcium Carbonate (CALCIUM 600 PO) Take 600 mg by mouth daily.     Marland Kitchen. DICLOFENAC PO Take by mouth as needed.    . Lactobacillus (ACIDOPHILUS PO) Take by mouth daily.    Marland Kitchen. levothyroxine (SYNTHROID, LEVOTHROID) 100 MCG tablet Take 100 mcg by mouth daily before breakfast. 200mcg daily, 100mcg on Sunday    . Multiple Vitamins-Minerals (MULTIVITAMIN PO) Take by mouth daily.    . NON FORMULARY OTC all natural leg cramp medication  2 qhs    . PARAGARD INTRAUTERINE COPPER IU by Intrauterine route.    . valACYclovir (VALTREX) 500 MG tablet 1 tablet bid x 3 days starting with onset of  symptoms 30 tablet 1  . VITAMIN D, CHOLECALCIFEROL, PO Take 4,000 Int'l Units by mouth daily.    . clobetasol ointment (TEMOVATE) 0.05 % Apply 1 application topically 2 (two) times daily. Apply as directed twice daily for up to two weeks (Patient not taking: Reported on 07/17/2014) 60 g 0   No current facility-administered medications for this visit.    Family History  Problem Relation Age of Onset  . Osteoporosis Mother   . Lung cancer Mother     non small cell   . Deep vein thrombosis Mother   . Thyroid disease Sister     hypo  . Cancer Maternal Grandmother     ovarian cancer  . Diabetes Maternal Grandfather   . Heart attack Paternal Grandfather   . Breast cancer Sister [id number]  . Thyroid cancer      maternal cousin    ROS:  Pertinent items are noted in HPI.  Otherwise, a comprehensive ROS was negative.  Exam:   General appearance: alert, cooperative and appears stated age Head: Normocephalic, without obvious abnormality, atraumatic Neck: no adenopathy, supple, symmetrical, trachea midline and thyroid enlarged on  both sides, no palpable nodules Lungs: clear to auscultation bilaterally Breasts: normal appearance, no masses or tenderness Heart: regular rate and rhythm Abdomen: soft, non-tender; bowel sounds normal; no masses,  no organomegaly Extremities: extremities normal, atraumatic, no cyanosis or edema Skin: Skin color, texture, turgor normal. No rashes or lesions Lymph nodes: Cervical, supraclavicular, and axillary nodes normal. No abnormal inguinal nodes palpated Neurologic: Grossly normal   Pelvic: External genitalia:  no lesions              Urethra:  normal appearing urethra with no masses, tenderness or lesions              Bartholins and Skenes: normal                 Vagina: normal appearing vagina with normal color and discharge, no lesions              Cervix: no lesions              Pap taken: Yes.   Bimanual Exam:  Uterus:  normal size, contour, position,  consistency, mobility, non-tender              Adnexa: normal adnexa and no mass, fullness, tenderness               Rectovaginal: Confirms               Anus:  normal sphincter tone, no lesions  Procedure:  Cervix visualized with IUD string.  String grasped with single toothed tenaculum and removed with one pull.  Scan bleeding noted.  Pt tolerated procedure well.  Chaperone was present for exam.  A:  Well Woman with normal exam PMP, no HRT Obesity Hypothyroidism with enlarged thyroid Recurrent HSV Sister with breast cancer, diagnosed age 52 Paraguard IUD use since 2006.    P: Mammogram yearly pap smear with neg HR HPV 2/13. Pap today. Paraguard removed today Release of records for labs and last Tdap from PCP Plan thyroid ultrasound return annually or prn

## 2014-07-17 NOTE — Addendum Note (Signed)
Addended by: Jerene BearsMILLER, Guss Farruggia S on: 07/17/2014 10:39 AM   Modules accepted: Kipp BroodSmartSet

## 2014-07-18 LAB — IPS PAP TEST WITH REFLEX TO HPV

## 2014-07-24 ENCOUNTER — Ambulatory Visit
Admission: RE | Admit: 2014-07-24 | Discharge: 2014-07-24 | Disposition: A | Discharge status: Home / Self Care | Payer: 59 | Source: Ambulatory Visit | Attending: Obstetrics & Gynecology | Admitting: Obstetrics & Gynecology

## 2014-07-24 DIAGNOSIS — E010 Iodine-deficiency related diffuse (endemic) goiter: Secondary | ICD-10-CM

## 2014-07-31 ENCOUNTER — Telehealth: Payer: Self-pay

## 2014-07-31 NOTE — Telephone Encounter (Signed)
Spoke with patient. Advised of results and message as seen below from Dr.Miller. Patient is agreeable and verbalizes understanding.  Routing to provider for final review. Patient agreeable to disposition. Will close encounter   

## 2014-07-31 NOTE — Telephone Encounter (Signed)
-----   Message from Jerene BearsMary S Miller, MD sent at 07/30/2014  6:28 AM EDT ----- Please inform pt that her thyroid ultrasound showed a nodule in the left aspect of her thyroid gland.  It does not meet the criteria for needing biopsy.  Should recheck in one year.  I will put in my own reminders about this.  Also, let he know I did get outside records regarding her tetanus shot and labs and her last tetanus was 2008.

## 2015-07-27 ENCOUNTER — Ambulatory Visit (INDEPENDENT_AMBULATORY_CARE_PROVIDER_SITE_OTHER): Payer: 59 | Admitting: Obstetrics & Gynecology

## 2015-07-27 ENCOUNTER — Encounter: Payer: Self-pay | Admitting: Obstetrics & Gynecology

## 2015-07-27 VITALS — BP 122/78 | HR 70 | Resp 12 | Ht 64.5 in | Wt 301.0 lb

## 2015-07-27 DIAGNOSIS — B379 Candidiasis, unspecified: Secondary | ICD-10-CM

## 2015-07-27 DIAGNOSIS — E041 Nontoxic single thyroid nodule: Secondary | ICD-10-CM | POA: Diagnosis not present

## 2015-07-27 DIAGNOSIS — Z01419 Encounter for gynecological examination (general) (routine) without abnormal findings: Secondary | ICD-10-CM

## 2015-07-27 MED ORDER — TRIAMCINOLONE ACETONIDE 0.025 % EX OINT: 1.0000 "application " | TOPICAL_OINTMENT | Freq: Two times a day (BID) | CUTANEOUS | Status: DC

## 2015-07-27 NOTE — Progress Notes (Signed)
[facility] for 07/28/15 at 1110. Patient agreeable to time/date/location.

## 2015-07-27 NOTE — Progress Notes (Signed)
55 y.o. [id number] MarriedCaucasianF here for annual exam.  Doing well.  No vaginal bleeding.  Had first dermatology appointment with Dr. Lovenia Kim last week.    PCP:  Dr. Cliffton Asters.  Will see her later this year.    Patient's last menstrual period was 05/02/2004.          Sexually active: No.  The current method of family planning is abstinence and post menopausal status.    Exercising: Yes.    recombant bike Smoker:  no  Health Maintenance: Pap:  07/17/14 Neg. 06/09/11 Neg. HR HPV:neg History of abnormal Pap:  no MMG:  01/20/15 BIRADS2:benign  Colonoscopy:  2014 Normal - repeat 10 years  BMD:   01/16/14 Normal  TDaP:  04/10/2007 Screening Labs: PCP, Urine today: PCP   reports that she has never smoked. She has never used smokeless tobacco. She reports that she does not drink alcohol or use illicit drugs.  Past Medical History  Diagnosis Date  . STD (sexually transmitted disease)     HSV  . Thyroid disease     hypothyroid  . Hyperlipidemia   . Abnormal Pap smear     gland. cells -> colpo -> neg endo bx, CIN I    Past Surgical History  Procedure Laterality Date  . Gastric bypass  5.2005  . Colposcopy      CIN I, neg endo bx   . Cesarean section    . Biopsy thyroid      Dr Talmage Nap 5-6 years ago-goiter on meds    Family History  Problem Relation Age of Onset  . Osteoporosis Mother   . Lung cancer Mother     non small cell   . Deep vein thrombosis Mother   . Thyroid disease Sister     hypo  . Cancer Maternal Grandmother     ovarian cancer  . Diabetes Maternal Grandfather   . Heart attack Paternal Grandfather   . Breast cancer Sister 58  . Thyroid cancer      maternal cousin    ROS:  Pertinent items are noted in HPI.  Otherwise, a comprehensive ROS was negative.  Exam:   BP 122/78 mmHg  Pulse 70  Resp 12  Ht 5' 4.5" (1.638 m)  Wt 301 lb (136.533 kg)  BMI 50.89 kg/m2  LMP 05/02/2004  Weight change: +13#  Height: 5' 4.5" (163.8 cm)  Ht Readings from Last 3  Encounters:  07/27/15 5' 4.5" (1.638 m)  07/17/14  (1.651 m)  07/11/13  (1.651 m)    General appearance: alert, cooperative and appears stated age Head: Normocephalic, without obvious abnormality, atraumatic Neck: no adenopathy, supple, symmetrical, trachea midline and thyroid normal to inspection and palpation Lungs: clear to auscultation bilaterally Breasts: normal appearance, no masses or tenderness Heart: regular rate and rhythm Abdomen: soft, non-tender; bowel sounds normal; no masses,  no organomegaly Extremities: extremities normal, atraumatic, no cyanosis or edema Skin: Skin color, texture, turgor normal. No rashes or lesions Lymph nodes: Cervical, supraclavicular, and axillary nodes normal. No abnormal inguinal nodes palpated Neurologic: Grossly normal   Pelvic: External genitalia:  Erythema on external skin down towards rectum, symmetric              Urethra:  normal appearing urethra with no masses, tenderness or lesions              Bartholins and Skenes: normal                 Vagina: normal appearing  vagina with normal color and discharge, no lesions              Cervix: no lesions              Pap taken: No. Bimanual Exam:  Uterus:  normal size, contour, position, consistency, mobility, non-tender              Adnexa: normal adnexa and no mass, fullness, tenderness               Rectovaginal: Confirms               Anus:  normal sphincter tone, no lesions  Chaperone was present for exam.  A:  Well Woman with normal exam PMP, no HRT Obesity Hypothyroidism with enlarged thyroid Recurrent HSV.  Hasn't used Valtrex in years. Sister with breast cancer, diagnosed age 53 Vulvar irritation   P: Mammogram yearly pap smear with neg HR HPV 2/13. Pap 2016 that was negative.  No pap today. Needs repeat thyroid ultrasound scheduled due to nodules. Will have physical exam and lab work with Dr. Cliffton AstersWhite later this spring Affirm pending.  If negative for  yeast, pt will use Kenalog 0.25% ointment BID x 14 days and have follow up to make sure skin is improving return annually or prn

## 2015-07-27 NOTE — Addendum Note (Signed)
Addended by: Joeseph AmorFAST, Alyra Patty L on: 07/27/2015 04:28 PM   Modules accepted: Orders

## 2015-07-27 NOTE — Patient Instructions (Signed)
Check with Dr. Cliffton AstersWhite when you go for your physical exam about having Hepatitis C testing.

## 2015-07-28 ENCOUNTER — Ambulatory Visit
Admission: RE | Admit: 2015-07-28 | Discharge: 2015-07-28 | Disposition: A | Discharge status: Home / Self Care | Payer: 59 | Source: Ambulatory Visit | Attending: Obstetrics & Gynecology | Admitting: Obstetrics & Gynecology

## 2015-07-28 DIAGNOSIS — E041 Nontoxic single thyroid nodule: Secondary | ICD-10-CM

## 2015-07-28 LAB — WET PREP BY MOLECULAR PROBE
Candida species: NEGATIVE
GARDNERELLA VAGINALIS: NEGATIVE
TRICHOMONAS VAG: NEGATIVE

## 2015-07-29 ENCOUNTER — Telehealth: Payer: Self-pay | Admitting: Emergency Medicine

## 2015-07-29 NOTE — Telephone Encounter (Signed)
  Patient notified of results. She will try cream as directed and scheduled follow up skin check in one month with Dr. Hyacinth MeekerMiller for 09/03/15 at 1445 with Dr. Hyacinth MeekerMiller. Last years she states cream was not covered and did not pick it up. She is advised if any problems at the pharmacy, to please call back and we can send in another prescription for her. Patient agreeable. Verbalized understanding of instructions.   Routing to provider for final review. Patient agreeable to disposition. Will close encounter.

## 2015-07-29 NOTE — Telephone Encounter (Signed)
-----   Message from Jerene BearsMary S Miller, MD sent at 07/28/2015  3:08 PM EDT ----- Please let pt know the testing for yeast was negative.  I'd like her to start the steroid ointment that I prescribed yesterday.  I'd like to see her again in the month for follow-up.

## 2015-07-31 ENCOUNTER — Telehealth: Payer: Self-pay | Admitting: Emergency Medicine

## 2015-07-31 NOTE — Telephone Encounter (Signed)
-----   Message from Jerene BearsMary S Miller, MD sent at 07/30/2015  5:28 AM EDT ----- Notified pt of results via Mychart.  If in imaging hold, ok to remove.  Pt note: [name], Your thyroid ultrasound continues to show the nodule in the left portion of your thyroid.  It is stable in size and measures 12 x 6mm.  You do not need a biopsy.  We will continue to follow this conservatively.  Please let me know if you have any questions.  Dr. Nemiah CommanderMille

## 2015-07-31 NOTE — Telephone Encounter (Signed)
Out of hold per Dr. Miller.  Will close encounter.   

## 2015-08-25 ENCOUNTER — Ambulatory Visit: Payer: 59 | Admitting: Obstetrics & Gynecology

## 2015-09-03 ENCOUNTER — Ambulatory Visit: Payer: 59 | Admitting: Obstetrics & Gynecology

## 2015-09-03 ENCOUNTER — Telehealth: Payer: Self-pay | Admitting: Obstetrics & Gynecology

## 2015-09-03 NOTE — Telephone Encounter (Signed)
Left message regarding her appointment for "skin check' with Dr.Miller that needs to be rescheduled. The office is closing due to a plumbing issue.

## 2015-09-07 ENCOUNTER — Ambulatory Visit (INDEPENDENT_AMBULATORY_CARE_PROVIDER_SITE_OTHER): Payer: 59 | Admitting: Obstetrics & Gynecology

## 2015-09-07 ENCOUNTER — Encounter: Payer: Self-pay | Admitting: Obstetrics & Gynecology

## 2015-09-07 VITALS — BP 106/70 | HR 66 | Resp 18 | Ht 64.5 in

## 2015-09-07 DIAGNOSIS — N9089 Other specified noninflammatory disorders of vulva and perineum: Secondary | ICD-10-CM | POA: Diagnosis not present

## 2015-09-07 DIAGNOSIS — Z205 Contact with and (suspected) exposure to viral hepatitis: Secondary | ICD-10-CM

## 2015-09-07 MED ORDER — TRIAMCINOLONE ACETONIDE 0.025 % EX OINT: 1.0000 "application " | TOPICAL_OINTMENT | Freq: Two times a day (BID) | CUTANEOUS | Status: DC

## 2015-09-07 NOTE — Progress Notes (Signed)
GYNECOLOGY  VISIT   HPI: 55 y.o. [phone] Married caucasion female here for recheck of vulvar skin.  Pt's last exam was 07/27/15.  At that time, extensive and symmetric erythema around the vagina and rectum were noted.  Pt was complaining of a little itching.  Denies discharge or VB.  Was started on a topical steroid which she states she is using.  Itching is gone.  She is here for recheck.  Of note, yeast testing on 07/27/15 was negative.  Has decided she'd like to proceed with Hep C testing now.  We discussed this at [facility] but she planned at that time to do blood work with Dr. Cliffton AstersWhite when she sees her next.  Has decided to proceed with that testing now.  Does not want/need anything else done.  GYNECOLOGIC HISTORY: Patient's last menstrual period was 05/02/2004. Contraception: PMP Menopausal hormone therapy: none  Patient Active Problem List   Diagnosis Date Noted  . Thyroid nodule 07/27/2015    Past Medical History  Diagnosis Date  . STD (sexually transmitted disease)     HSV  . Thyroid disease     hypothyroid  . Hyperlipidemia   . Abnormal Pap smear     gland. cells -> colpo -> neg endo bx, CIN I    Past Surgical History  Procedure Laterality Date  . Gastric bypass  5.2005  . Colposcopy      CIN I, neg endo bx   . Cesarean section    . Biopsy thyroid      Dr Talmage NapBalan 5-6 years ago-goiter on meds    MEDS:  Reviewed in EPIC and UTD  ALLERGIES: Codeine; Erythromycin; and Vicodin  Family History  Problem Relation Age of Onset  . Osteoporosis Mother   . Lung cancer Mother     non small cell   . Deep vein thrombosis Mother   . Thyroid disease Sister     hypo  . Cancer Maternal Grandmother     ovarian cancer  . Diabetes Maternal Grandfather   . Heart attack Paternal Grandfather   . Breast cancer Sister [id number]  . Thyroid cancer      maternal cousin    SH:  Married, non smoker  Review of Systems  All other systems reviewed and are negative.   PHYSICAL EXAMINATION:     BP 106/70 mmHg  Pulse 66  Resp 18  Ht 5' 4.5" (1.638 m)  Wt   LMP 05/02/2004     Physical Exam  Constitutional: She appears well-developed and well-nourished.  GI: Soft. Bowel sounds are normal.  Genitourinary: Vagina normal.    There is no rash, tenderness or lesion on the right labia. There is no rash, tenderness or lesion on the left labia.  Lymphadenopathy:       Right: No inguinal adenopathy present.       Left: No inguinal adenopathy present.  Skin: Skin is warm and dry.  Psychiatric: She has a normal mood and affect.    Chaperone was present for exam.  Assessment: Vulvar erythema, itching, irritation, moderately improved Desires for Hep C screening  Plan: Continue trimacinolone 0.025% ointment bid.  Recheck 1 month. Hep C testing will be obtained today.   ~15 minutes spent with patient >50% of time was in face to face discussion of above findings including possible need for biopsy at next visit as well as possible causes and additional treatment options.

## 2015-09-09 LAB — HEPATITIS C ANTIBODY: HCV Ab: NEGATIVE

## 2015-10-19 ENCOUNTER — Ambulatory Visit (INDEPENDENT_AMBULATORY_CARE_PROVIDER_SITE_OTHER): Payer: 59 | Admitting: Obstetrics & Gynecology

## 2015-10-19 ENCOUNTER — Encounter: Payer: Self-pay | Admitting: Obstetrics & Gynecology

## 2015-10-19 VITALS — BP 130/84 | HR 72 | Resp 16

## 2015-10-19 DIAGNOSIS — N9089 Other specified noninflammatory disorders of vulva and perineum: Secondary | ICD-10-CM

## 2015-10-19 NOTE — Progress Notes (Signed)
GYNECOLOGY  VISIT   HPI: 55 y.o. [id number] Married Caucasian female with hx of vulvar irritation and skin changes here for follow up.  She has been using topical steroids with some success.  Denies urinary or bowel changes.  No fever.  No skin lesions or ulcerations.  GYNECOLOGIC HISTORY: Patient's last menstrual period was 05/02/2004. Contraception: PMP   Patient Active Problem List   Diagnosis Date Noted  . Thyroid nodule 07/27/2015    Past Medical History  Diagnosis Date  . STD (sexually transmitted disease)     HSV  . Thyroid disease     hypothyroid  . Hyperlipidemia   . Abnormal Pap smear     gland. cells -> colpo -> neg endo bx, CIN I    Past Surgical History  Procedure Laterality Date  . Gastric bypass  5.2005  . Colposcopy      CIN I, neg endo bx   . Cesarean section    . Biopsy thyroid      Dr Talmage NapBalan 5-6 years ago-goiter on meds    MEDS:  Reviewed in EPIC and UTD  ALLERGIES: Codeine; Erythromycin; and Vicodin  Family History  Problem Relation Age of Onset  . Osteoporosis Mother   . Lung cancer Mother     non small cell   . Deep vein thrombosis Mother   . Thyroid disease Sister     hypo  . Cancer Maternal Grandmother     ovarian cancer  . Diabetes Maternal Grandfather   . Heart attack Paternal Grandfather   . Breast cancer Sister [id number]  . Thyroid cancer      maternal cousin    SH:  Married, non smoker  Review of Systems  All other systems reviewed and are negative.   PHYSICAL EXAMINATION:    BP 130/84 mmHg  Pulse 72  Resp 16  Wt   LMP 05/02/2004    Physical Exam  Constitutional: She is oriented to person, place, and time. She appears well-developed and well-nourished.  Genitourinary: Vagina normal.     Lymphadenopathy:       Right: No inguinal adenopathy present.       Left: No inguinal adenopathy present.  Neurological: She is alert and oriented to person, place, and time.  Skin: Skin is warm and dry.  Psychiatric: She has a normal  mood and affect.   Vulvar biopsy recommended.  Procedure described to patient.  Informed consent obtained.  All questions answered before proceeding.    Procedure:  Area cleansed with Betadine.  Sterile technique used throughout procedure.  Skin anesthestized with Lidocaine 1% plain; 1.643mL.  Lot:  [id number], exp: 06/30/2016.  Total amount used:  1.3 cc's.  3mm punch biopsy used to obtain specimen.  Biopsy grasped with pick-ups and excised with scissors.  Adequate hemostasis obtained with silver nitrate sticks.  Dressing was not applied.  Pt tolerated procedure well.  Chaperone was present for exam.  Assessment: Vulvar irritation and persistent skin lesion despite topical steroid use.  Biopsy obtained today.  Plan: Results and additional recommendations will be made when results are back.

## 2015-10-27 ENCOUNTER — Telehealth: Payer: Self-pay | Admitting: Obstetrics & Gynecology

## 2015-10-27 NOTE — Telephone Encounter (Signed)
Spoke with patient. Advised of results as seen below from Dr.Miller. Patient verbalizes understanding. Reports she stopped using the steroid ointment last week. In the morning she has been drying her skin really well and applying neosporin to the area. Reports she has not had any symptoms with using neosporin. Asking if she may continue with this instead of restarting the steroid ointment. Advised I will speak with Dr.Miller and return call with further recommendations. She is agreeable.  Notes Recorded by Jerene BearsMary S Miller, MD on 10/27/2015 at 12:58 PM Please let pt know the biopsy showed lichen sclerosus, exactly what I thought it was. No precancerous skin changes present. She should decrease the steroid ointment to nightly use and recheck in 3 months.

## 2015-10-27 NOTE — Telephone Encounter (Signed)
Pathology report from 10/19/2015 to Dr.Miller for review and advise.

## 2015-10-27 NOTE — Telephone Encounter (Signed)
Patient wants to know if her results are in yet. °

## 2015-10-28 NOTE — Telephone Encounter (Signed)
Spoke with patient. Advised of message as seen below from Dr.Miller. She is agreeable and verbalizes understanding.  Routing to provider for final review. Patient agreeable to disposition. Will close encounter.  

## 2015-10-28 NOTE — Telephone Encounter (Signed)
That is fine to just use neosporin.  If not having any issues, ok to just follow up for [facility].  Thanks.

## 2015-12-24 ENCOUNTER — Ambulatory Visit (INDEPENDENT_AMBULATORY_CARE_PROVIDER_SITE_OTHER): Payer: 59 | Admitting: Podiatry

## 2015-12-24 ENCOUNTER — Encounter: Payer: Self-pay | Admitting: Podiatry

## 2015-12-24 VITALS — BP 129/70 | HR 70 | Resp 16 | Ht 65.0 in | Wt 294.0 lb

## 2015-12-24 DIAGNOSIS — B070 Plantar wart: Secondary | ICD-10-CM | POA: Diagnosis not present

## 2015-12-24 DIAGNOSIS — B078 Other viral warts: Secondary | ICD-10-CM

## 2015-12-24 DIAGNOSIS — B079 Viral wart, unspecified: Secondary | ICD-10-CM

## 2015-12-24 NOTE — Progress Notes (Signed)
   Subjective:    Patient ID: [name], female    DOB: 08/01/1960, 55 y.o.   MRN: [id number]  HPI Chief Complaint  Patient presents with  . Painful lesion    Right foot; plantar forefoot-below great toe; sensitive to touch; sore; pt stated, "Been using OTC medication on lesion for past 3 months and has not helped"      Review of Systems  All other systems reviewed and are negative.      Objective:   Physical Exam        Assessment & Plan:

## 2015-12-24 NOTE — Patient Instructions (Signed)

## 2015-12-24 NOTE — Progress Notes (Signed)
Subjective:     Patient ID: [name], female   DOB: 10/21/1960, 55 y.o.   MRN: [id number]  HPI patient presents stating I have this terrible calluses underneath my right foot that's been trimmed several times and has not gotten better. Makes it hard to walk   Review of Systems  All other systems reviewed and are negative.      Objective:   Physical Exam  Constitutional: She is oriented to person, place, and time.  Cardiovascular: Intact distal pulses.   Musculoskeletal: Normal range of motion.  Neurological: She is oriented to person, place, and time.  Skin: Skin is warm.  Nursing note and vitals reviewed.  neurovascular status intact muscle strength adequate range of motion within normal limits. Patient's found to have a keratotic lesion sub-first metatarsal right measuring approximately 2.0 cm time 10 that upon debridement shows pinpoint bleeding and pain to lateral pressure. Patient is noted to have good digital perfusion and is well oriented 3     Assessment:     Painful verruca plantaris sub-first metatarsal right    Plan:     H&P condition reviewed and at this time recommended excision. I went ahead and infiltrated with 60 mg Xylocaine Marcaine with epinephrine and after appropriate numbness and sterile prep to the foot I went ahead and excised the lesion and took it down to the base and removed and sent for pathological evaluation placed phenol on the base and applied sterile dressing. Reappoint to recheck

## 2016-01-26 ENCOUNTER — Encounter: Payer: Self-pay | Admitting: Obstetrics & Gynecology

## 2016-05-27 ENCOUNTER — Other Ambulatory Visit: Payer: Self-pay | Admitting: Obstetrics & Gynecology

## 2016-05-27 NOTE — Telephone Encounter (Signed)
Medication refill request: Valacyclovir Last AEX:  07/27/15 SM Next AEX: 11/08/16 SM Last MMG (if hormonal medication request): 01/21/16 BIRADS1, Density B, [employer] Refill authorized: 07/15/13 #30 1R. Please advise. Thank you.

## 2016-11-08 ENCOUNTER — Ambulatory Visit: Payer: 59 | Admitting: Obstetrics & Gynecology

## 2016-11-08 NOTE — Progress Notes (Deleted)
56 y.o. [id number] MarriedCaucasianF here for annual exam.    Patient's last menstrual period was 05/02/2006.          Sexually active: {yes no:314532}  The current method of family planning is post menopausal status.    Exercising: {yes no:314532}  {types:19826} Smoker:  no  Health Maintenance: Pap:  07/17/14 negative, 06/09/11 negative, HR HPV negative  History of abnormal Pap:  no MMG:  01/21/16 BIRADS 1 negative  Colonoscopy:  2014 normal- repeat 10 years  BMD:   01/16/14 normal  TDaP:  04/10/07  Pneumonia vaccine(s):  *** Zostavax:   *** Hep C testing: 09/07/15 negative  Screening Labs: ***, Hb today: ***, Urine today: ***   reports that she has never smoked. She has never used smokeless tobacco. She reports that she does not drink alcohol or use drugs.  Past Medical History:  Diagnosis Date  . Abnormal Pap smear    gland. cells -> colpo -> neg endo bx, CIN I  . Hyperlipidemia   . STD (sexually transmitted disease)    HSV  . Thyroid disease    hypothyroid    Past Surgical History:  Procedure Laterality Date  . BIOPSY THYROID     Dr Talmage NapBalan 5-6 years ago-goiter on meds  . CESAREAN SECTION    . COLPOSCOPY     CIN I, neg endo bx   . GASTRIC BYPASS  5.2005    Current Outpatient Prescriptions  Medication Sig Dispense Refill  . atorvastatin (LIPITOR) 20 MG tablet Take 20 mg by mouth daily.    . Biotin 1000 MCG tablet Take 1,000 mcg by mouth daily.    . Calcium Carbonate (CALCIUM 600 PO) Take 600 mg by mouth daily.     Marland Kitchen. DICLOFENAC PO Take by mouth as needed.    . Lactobacillus (ACIDOPHILUS PO) Take by mouth daily.    Marland Kitchen. levothyroxine (SYNTHROID, LEVOTHROID) 100 MCG tablet Take 100 mcg by mouth daily before breakfast. 200mcg daily, 100mcg on Sunday    . Lorcaserin HCl (BELVIQ) 10 MG TABS Take 10 mg by mouth 2 (two) times daily. Diet pill    . Multiple Vitamins-Minerals (MULTIVITAMIN PO) Take by mouth daily.    . NON FORMULARY OTC all natural leg cramp medication  2 qhs    .  triamcinolone (KENALOG) 0.025 % ointment Apply 1 application topically 2 (two) times daily. Use for two weeks 80 g 0  . valACYclovir (VALTREX) 500 MG tablet TAKE ONE TABLET BY MOUTH TWICE DAILY FOR 3 DAYS STARTING WITH ONSET OF SYMPTOMS 30 tablet 1  . VITAMIN D, CHOLECALCIFEROL, PO Take 4,000 Int'l Units by mouth daily.     No current facility-administered medications for this visit.     Family History  Problem Relation Age of Onset  . Osteoporosis Mother   . Lung cancer Mother        non small cell   . Deep vein thrombosis Mother   . Cancer Maternal Grandmother        ovarian cancer  . Heart attack Paternal Grandfather   . Thyroid disease Sister        hypo  . Diabetes Maternal Grandfather   . Breast cancer Sister [id number]  . Thyroid cancer Unknown        maternal cousin    ROS:  Pertinent items are noted in HPI.  Otherwise, a comprehensive ROS was negative.  Exam:   LMP 05/02/2006   Weight change: @WEIGHTCHANGE @ Height:      Ht Readings from  Last 3 Encounters:  12/24/15 5\' 5"  (1.651 m)  09/07/15 5' 4.5" (1.638 m)  07/27/15 5' 4.5" (1.638 m)    General appearance: alert, cooperative and appears stated age Head: Normocephalic, without obvious abnormality, atraumatic Neck: no adenopathy, supple, symmetrical, trachea midline and thyroid {EXAM; THYROID:18604} Lungs: clear to auscultation bilaterally Breasts: {Exam; breast:13139::"normal appearance, no masses or tenderness"} Heart: regular rate and rhythm Abdomen: soft, non-tender; bowel sounds normal; no masses,  no organomegaly Extremities: extremities normal, atraumatic, no cyanosis or edema Skin: Skin color, texture, turgor normal. No rashes or lesions Lymph nodes: Cervical, supraclavicular, and axillary nodes normal. No abnormal inguinal nodes palpated Neurologic: Grossly normal   Pelvic: External genitalia:  no lesions              Urethra:  normal appearing urethra with no masses, tenderness or lesions               Bartholins and Skenes: normal                 Vagina: normal appearing vagina with normal color and discharge, no lesions              Cervix: {exam; cervix:14595}              Pap taken: {yes no:314532} Bimanual Exam:  Uterus:  {exam; uterus:12215}              Adnexa: {exam; adnexa:12223}               Rectovaginal: Confirms               Anus:  normal sphincter tone, no lesions  Chaperone was present for exam.  A:  Well Woman with normal exam  P:   {plan; gyn:5269::"mammogram","pap smear","return annually or prn"}

## 2016-11-24 NOTE — Progress Notes (Signed)
56 y.o. [id number] Married Caucasian F here for annual exam.  Doing well.  Denies vaginal bleeding.  D/w pt need to schedule thyroid ultrasound.  Has not had done this year.  H/o solid slightly >1cm nodule.  PCP:  Dr. Cliffton AstersWhite.  Knows Tdap due by end of year.  Will do with her.    Patient's last menstrual period was 05/02/2006.          Sexually active: No.  The current method of family planning is post menopausal status.    Exercising: Yes.    recumbant bike  Smoker:  no  Health Maintenance: Pap:  07/17/14 negative, 06/09/11 negative, HR HPV negative  History of abnormal Pap:  no MMG:  01/21/16 BIRADS 1 negative  Colonoscopy:  2014- normal- repeat 10 years  BMD:   01/16/14 normal- repeat 5 years  TDaP:  04/10/07- will update with PCP  Pneumonia vaccine(s):  never Shingles vaccination:   D/w pt today Hep C testing: 09/07/15 negative  Screening Labs: PCP, Hb today: PCP   reports that she has never smoked. She has never used smokeless tobacco. She reports that she does not drink alcohol or use drugs.  Past Medical History:  Diagnosis Date  . Abnormal Pap smear    gland. cells -> colpo -> neg endo bx, CIN I  . Hyperlipidemia   . STD (sexually transmitted disease)    HSV  . Thyroid disease    hypothyroid    Past Surgical History:  Procedure Laterality Date  . BIOPSY THYROID     Dr Talmage NapBalan 5-6 years ago-goiter on meds  . CESAREAN SECTION    . COLPOSCOPY     CIN I, neg endo bx   . GASTRIC BYPASS  5.2005    Current Outpatient Prescriptions  Medication Sig Dispense Refill  . atorvastatin (LIPITOR) 20 MG tablet Take 20 mg by mouth daily.    . Biotin 1000 MCG tablet Take 1,000 mcg by mouth daily.    . Calcium Carbonate (CALCIUM 600 PO) Take 600 mg by mouth daily.     . diclofenac (VOLTAREN) 75 MG EC tablet as needed.     Marland Kitchen. DICLOFENAC PO Take by mouth as needed.    . Lactobacillus (ACIDOPHILUS PO) Take by mouth daily.    Marland Kitchen. levothyroxine (SYNTHROID, LEVOTHROID) 100 MCG tablet Take 100 mcg by  mouth daily before breakfast. 200mcg daily, 100mcg on Sunday    . Multiple Vitamins-Minerals (MULTIVITAMIN PO) Take by mouth daily.    . NON FORMULARY OTC all natural leg cramp medication  2 qhs    . triamcinolone (KENALOG) 0.025 % ointment Apply 1 application topically 2 (two) times daily. Use for two weeks 80 g 0  . valACYclovir (VALTREX) 500 MG tablet TAKE ONE TABLET BY MOUTH TWICE DAILY FOR 3 DAYS STARTING WITH ONSET OF SYMPTOMS 30 tablet 1  . VITAMIN D, CHOLECALCIFEROL, PO Take 4,000 Int'l Units by mouth daily.    . Lorcaserin HCl (BELVIQ) 10 MG TABS Take 10 mg by mouth 2 (two) times daily. Diet pill     No current facility-administered medications for this visit.     Family History  Problem Relation Age of Onset  . Osteoporosis Mother   . Lung cancer Mother        non small cell   . Deep vein thrombosis Mother   . Cancer Maternal Grandmother        ovarian cancer  . Heart attack Paternal Grandfather   . Thyroid disease Sister  hypo  . Diabetes Maternal Grandfather   . Breast cancer Sister [id number]  . Thyroid cancer Unknown        maternal cousin    ROS:  Pertinent items are noted in HPI.  Otherwise, a comprehensive ROS was negative.  Exam:   BP 126/70 (BP Location: Right Arm, Patient Position: Sitting, Cuff Size: Large)   Pulse 74   Resp 16   Ht 5\' 5"  (1.651 m)   LMP 05/02/2006     Height: 5\' 5"  (165.1 cm)  Ht Readings from Last 3 Encounters:  11/25/16 5\' 5"  (1.651 m)  12/24/15 5\' 5"  (1.651 m)  09/07/15 5' 4.5" (1.638 m)    General appearance: alert, cooperative and appears stated age Head: Normocephalic, without obvious abnormality, atraumatic Neck: no adenopathy, supple, enlarged, no nodules noted Lungs: clear to auscultation bilaterally Breasts: normal appearance, no masses or tenderness Heart: regular rate and rhythm Abdomen: soft, non-tender; bowel sounds normal; no masses,  no organomegaly Extremities: extremities normal, atraumatic, no cyanosis or  edema Skin: Skin color, texture, turgor normal. No rashes or lesions Lymph nodes: Cervical, supraclavicular, and axillary nodes normal. No abnormal inguinal nodes palpated Neurologic: Grossly normal   Pelvic: External genitalia:  Mild erythema from mid labia majora down around rectum (biopsied last year showing LS&A)              Urethra:  normal appearing urethra with no masses, tenderness or lesions              Bartholins and Skenes: normal                 Vagina: normal appearing vagina with normal color and discharge, no lesions              Cervix: no lesions              Pap taken: Yes.   Bimanual Exam:  Uterus:  normal size, contour, position, consistency, mobility, non-tender              Adnexa: normal adnexa and no mass, fullness, tenderness               Rectovaginal: Confirms               Anus:  normal sphincter tone, no lesions  Chaperone was present for exam.  A:  Well Woman with normal exam PMP, no HRT Obesity Hypothyroidism with h/o enlarged thyroid with nodule H/O recurrent HSV H/O breast cancer, sister diagnosed at age 54 Biopsy proven LS&A  P:   Mammogram guidelines reviewed.  Pt doing 3D MMG. pap smear with HR HPV obtained today No rx for valtrex of triamcinolone ointment needed Thyroid ultrasound will be scheduled Update Tdap today return annually or prn

## 2016-11-25 ENCOUNTER — Encounter: Payer: Self-pay | Admitting: Obstetrics & Gynecology

## 2016-11-25 ENCOUNTER — Other Ambulatory Visit (HOSPITAL_COMMUNITY)
Admission: RE | Admit: 2016-11-25 | Discharge: 2016-11-25 | Disposition: A | Discharge status: Home / Self Care | Payer: 59 | Source: Ambulatory Visit | Attending: Obstetrics & Gynecology | Admitting: Obstetrics & Gynecology

## 2016-11-25 ENCOUNTER — Ambulatory Visit (INDEPENDENT_AMBULATORY_CARE_PROVIDER_SITE_OTHER): Payer: 59 | Admitting: Obstetrics & Gynecology

## 2016-11-25 VITALS — BP 126/70 | HR 74 | Resp 16 | Ht 65.0 in

## 2016-11-25 DIAGNOSIS — Z23 Encounter for immunization: Secondary | ICD-10-CM

## 2016-11-25 DIAGNOSIS — E039 Hypothyroidism, unspecified: Secondary | ICD-10-CM | POA: Diagnosis not present

## 2016-11-25 DIAGNOSIS — Z124 Encounter for screening for malignant neoplasm of cervix: Secondary | ICD-10-CM

## 2016-11-25 DIAGNOSIS — E041 Nontoxic single thyroid nodule: Secondary | ICD-10-CM

## 2016-11-25 DIAGNOSIS — Z01419 Encounter for gynecological examination (general) (routine) without abnormal findings: Secondary | ICD-10-CM | POA: Diagnosis not present

## 2016-11-25 DIAGNOSIS — B009 Herpesviral infection, unspecified: Secondary | ICD-10-CM | POA: Diagnosis not present

## 2016-11-25 NOTE — Patient Instructions (Addendum)
Last Tdap was 04/10/07.  Due for update this year.    Shingles vaccination:  Shingrix.  Two vaccinations.  Second one is given 2-6 months after the first one.

## 2016-11-25 NOTE — Progress Notes (Signed)
Scheduled patient while in [facility] [address] on 12/14/16 at 3:55 pm. Patient declines all earlier appointments. Placed in imaging hold.

## 2016-11-29 LAB — CYTOLOGY - PAP
ADEQUACY: ABSENT
Diagnosis: NEGATIVE
HPV: NOT DETECTED

## 2016-12-14 ENCOUNTER — Ambulatory Visit
Admission: RE | Admit: 2016-12-14 | Discharge: 2016-12-14 | Disposition: A | Discharge status: Home / Self Care | Payer: 59 | Source: Ambulatory Visit | Attending: Obstetrics & Gynecology | Admitting: Obstetrics & Gynecology

## 2016-12-14 DIAGNOSIS — E041 Nontoxic single thyroid nodule: Secondary | ICD-10-CM

## 2017-01-30 IMAGING — US US SOFT TISSUE HEAD/NECK
1 series · 13 of 25 positions shown · non-contrast
Comparison: Several prior study, most recent 04/15/2008, 06/06/2007

CLINICAL DATA: 54-year-old female with a history of thyromegaly.

Patient has a history of right thyroid nodule biopsy
EXAM:
THYROID ULTRASOUND
TECHNIQUE: Ultrasound examination of the thyroid gland and adjacent soft
tissues was performed.

[Series 1: us soft tissue head/neck · 0.11mm/px · 13 of 62 slices shown]
[im 1/62]
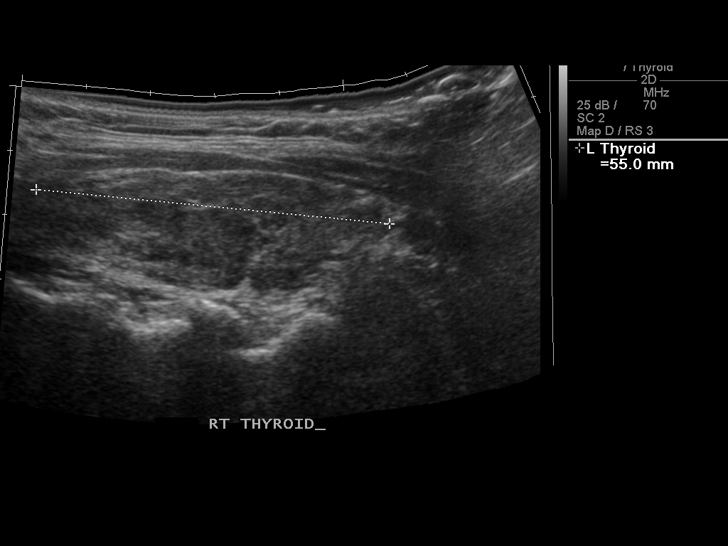
[im 6/62]
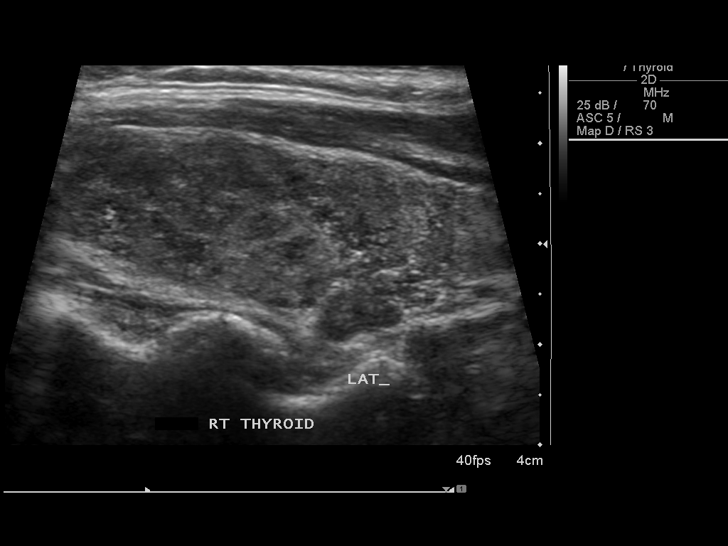
[im 11/62]
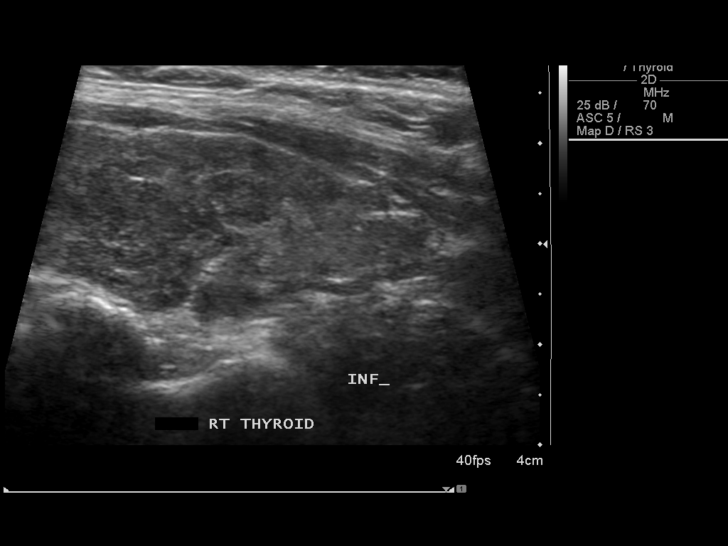
[im 16/62]
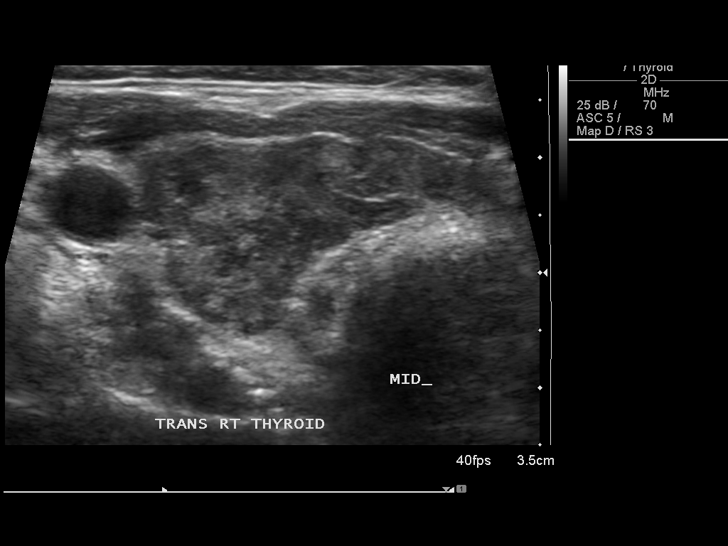
[im 21/62]
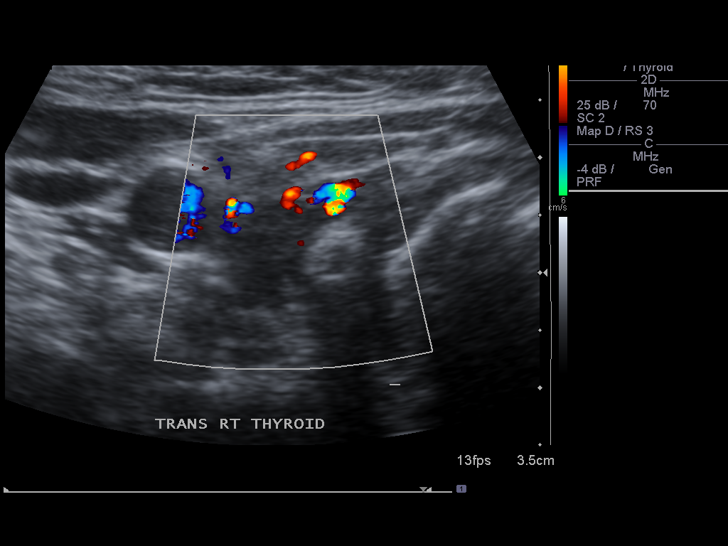
[im 26/62]
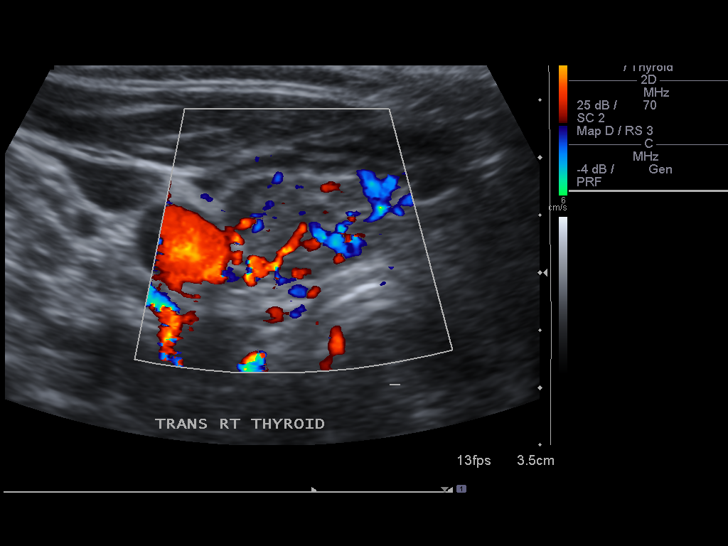
[im 31/62]
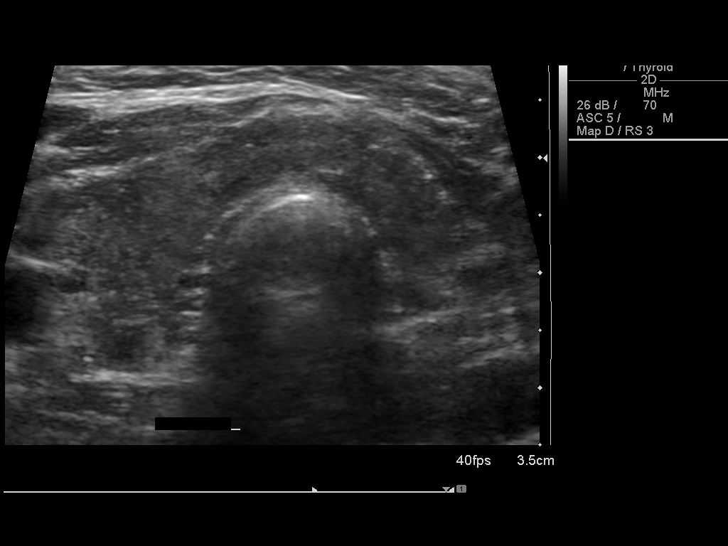
[im 36/62]
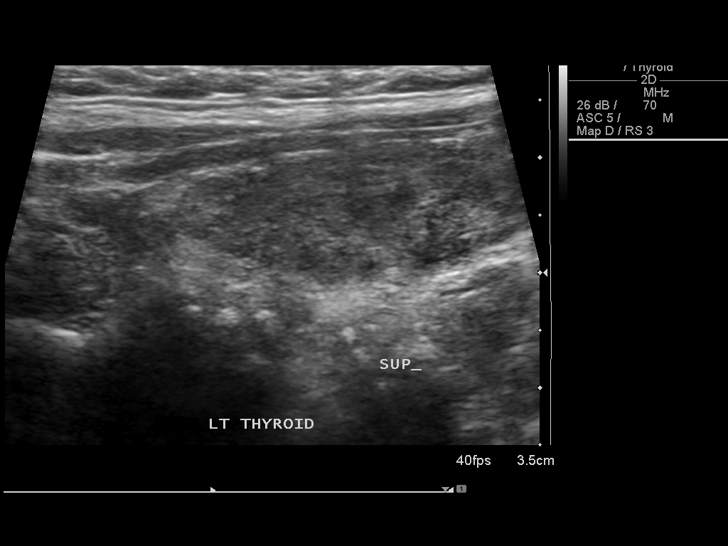
[im 41/62]
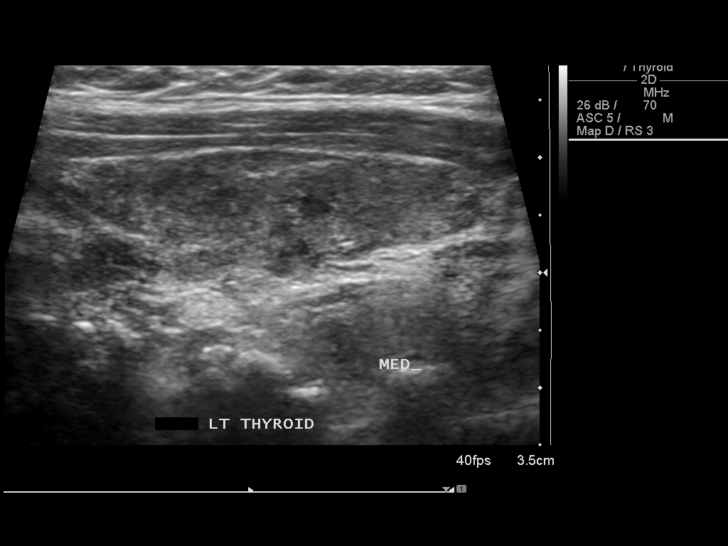
[im 46/62]
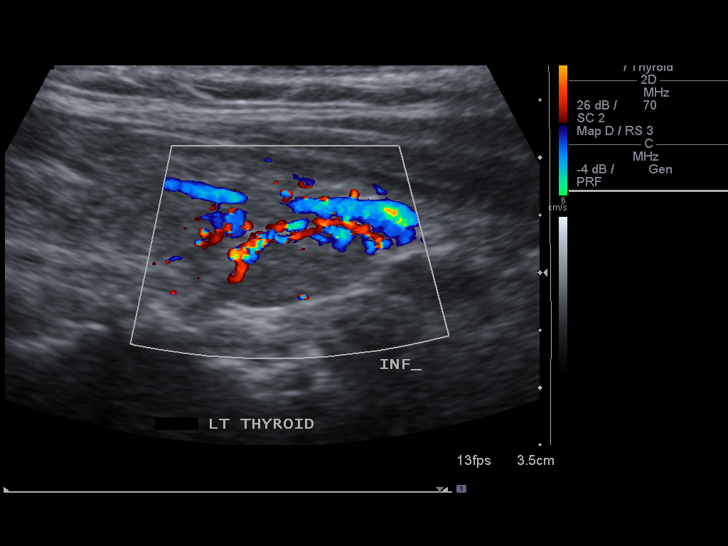
[im 51/62]
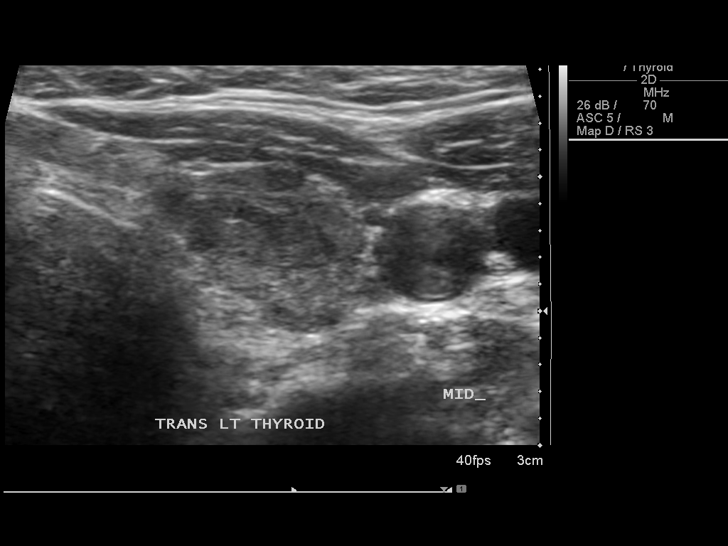
[im 56/62]
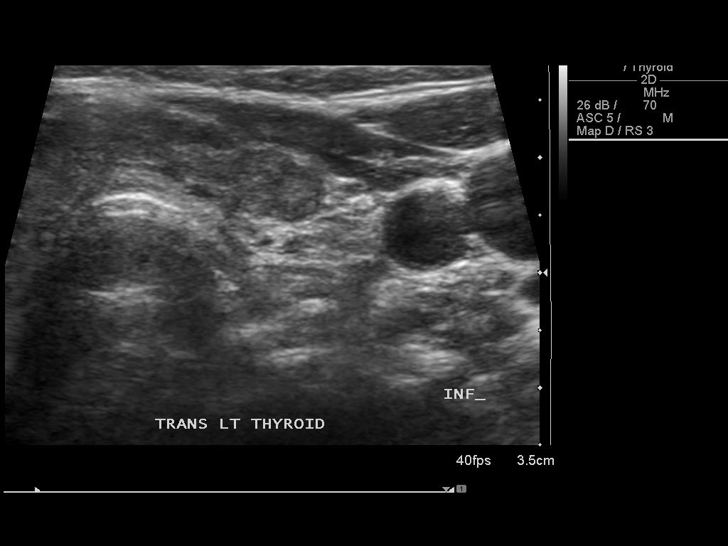
[im 62/62]
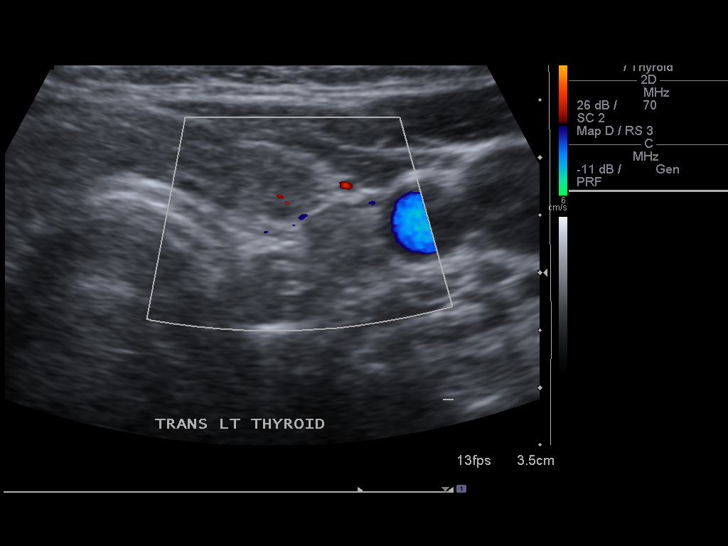

[13 of 25 positions shown; findings below may reference images not displayed]

FINDINGS: Right thyroid lobe

Measurements: 5.5 cm x 1.9 cm x 2.0 cm. Heterogeneous echotexture of
the right thyroid. No discrete nodule identified. Previously
biopsied nodule is not identified.

Left thyroid lobe

Measurements: 4.2 cm x 1.3 cm x 1.5 cm. Heterogeneous appearance of
the left thyroid tissue. Single discrete nodule at the inferior left
thyroid measures 1.3 cm x 7 mm x 8 mm. No internal reflectors.
Well-defined margin. Not exophytic.

Isthmus

Thickness: 7 mm.  No nodules visualized.

Lymphadenopathy

None visualized.
IMPRESSION: Heterogeneous thyroid with single left-sided nodule. Nodule does not
meet criteria for biopsy.

Follow-up by clinical exam is recommended. If patient has known risk
factors for thyroid carcinoma, consider follow-up ultrasound in 12
months. If patient is clinically hyperthyroid, consider nuclear
medicine thyroid uptake and scan.Reference: Management of Thyroid
Nodules Detected at US: Society of Radiologists in Ultrasound

The previously biopsied nodule on the right is not identified on the
current study.

## 2017-02-22 ENCOUNTER — Encounter: Payer: Self-pay | Admitting: Obstetrics & Gynecology

## 2017-12-25 ENCOUNTER — Telehealth: Payer: Self-pay | Admitting: Obstetrics & Gynecology

## 2017-12-25 NOTE — Telephone Encounter (Signed)
Patient "found two lumps" and is wondering if she should see her pcp or Dr.Miller?

## 2017-12-25 NOTE — Telephone Encounter (Signed)
Spoke with patient. Patient reports 2 lumps in left axilla. Lumps are discolored and tender. Noticed 1st lump 1 wk ago, 2nd lump 2 days ago. Denies any other symptoms. Patient is concerned, sister has hx of breast cancer.   Recommended OV for further evaluation. Patient request first available MD. OV scheduled for 8/27 at 9:30am with Dr. Edward JollySilva.   Routing to provider for final review. Patient is agreeable to disposition. Will close encounter.  Cc: Dr. Hyacinth MeekerMiller

## 2017-12-25 NOTE — Progress Notes (Signed)
GYNECOLOGY  VISIT   HPI: 57 y.o.   Married  Caucasian  female   [phone] with Patient's last menstrual period was 05/02/2006.   here for   2 lumps left axilla.  Noticed one lump one week ago.  Thought it was a pimple.  Noticed another one appeared.  Can feel it if she holds her arm a certain way. Not draining but did try to squeeze it.  It was discolored before she squeezed it.   Some left breast tenderness as well.   Has an appointment on 01/24/18 for mammogram at [facility].   GYNECOLOGIC HISTORY: Patient's last menstrual period was 05/02/2006. Contraception:  Post menopuasal  Menopausal hormone therapy:  None  Last mammogram:  01-25-17 density B/BIRADS 2 benign  Last pap smear:   11-25-16 negative, HR HPV negative         OB History    Gravida  4   Para  2   Term  2   Preterm      AB  2   Living  2     SAB      TAB      Ectopic      Multiple      Live Births  2              Patient Active Problem List   Diagnosis Date Noted  . HSV (herpes simplex virus) infection 11/25/2016  . Hypothyroid 11/25/2016  . Thyroid nodule 07/27/2015    Past Medical History:  Diagnosis Date  . Abnormal Pap smear    gland. cells -> colpo -> neg endo bx, CIN I  . Hyperlipidemia   . STD (sexually transmitted disease)    HSV  . Thyroid disease    hypothyroid    Past Surgical History:  Procedure Laterality Date  . BIOPSY THYROID     Dr Chalmers Cater 5-6 years ago-goiter on meds  . CESAREAN SECTION    . COLPOSCOPY     CIN I, neg endo bx   . GASTRIC BYPASS  5.2005    Current Outpatient Medications  Medication Sig Dispense Refill  . atorvastatin (LIPITOR) 20 MG tablet Take 20 mg by mouth daily.    . Biotin 1000 MCG tablet Take 1,000 mcg by mouth daily.    . Calcium Carbonate (CALCIUM 600 PO) Take 600 mg by mouth daily.     . diclofenac (VOLTAREN) 75 MG EC tablet as needed.     . Lactobacillus (ACIDOPHILUS PO) Take by mouth daily.    Marland Kitchen levothyroxine (SYNTHROID, LEVOTHROID)  100 MCG tablet Take 100 mcg by mouth daily before breakfast. 243mg daily, 1083m on Sunday    . Multiple Vitamins-Minerals (MULTIVITAMIN PO) Take by mouth daily.    . NON FORMULARY OTC all natural leg cramp medication  2 qhs    . ranitidine (ZANTAC) 300 MG tablet Take 300 mg by mouth daily.  1  . valACYclovir (VALTREX) 500 MG tablet TAKE ONE TABLET BY MOUTH TWICE DAILY FOR 3 DAYS STARTING WITH ONSET OF SYMPTOMS 30 tablet 1  . VITAMIN D, CHOLECALCIFEROL, PO Take 4,000 Int'l Units by mouth daily.     No current facility-administered medications for this visit.      ALLERGIES: Codeine; Erythromycin; and Vicodin [hydrocodone-acetaminophen]  Family History  Problem Relation Age of Onset  . Osteoporosis Mother   . Lung cancer Mother        non small cell   . Deep vein thrombosis Mother   . Cancer Maternal Grandmother  ovarian cancer  . Heart attack Paternal Grandfather   . Diabetes Maternal Grandfather   . Breast cancer Sister 35  . Thyroid disease Sister   . Thyroid cancer Unknown        maternal cousin    Social History   Socioeconomic History  . Marital status: Married    Spouse name: Not on file  . Number of children: Not on file  . Years of education: Not on file  . Highest education level: Not on file  Occupational History  . Not on file  Social Needs  . Financial resource strain: Not on file  . Food insecurity:    Worry: Not on file    Inability: Not on file  . Transportation needs:    Medical: Not on file    Non-medical: Not on file  Tobacco Use  . Smoking status: Never Smoker  . Smokeless tobacco: Never Used  Substance and Sexual Activity  . Alcohol use: No  . Drug use: No  . Sexual activity: Not Currently    Partners: Male    Birth control/protection: Post-menopausal, Abstinence  Lifestyle  . Physical activity:    Days per week: Not on file    Minutes per session: Not on file  . Stress: Not on file  Relationships  . Social connections:    Talks  on phone: Not on file    Gets together: Not on file    Attends religious service: Not on file    Active member of club or organization: Not on file    Attends meetings of clubs or organizations: Not on file    Relationship status: Not on file  . Intimate partner violence:    Fear of current or ex partner: Not on file    Emotionally abused: Not on file    Physically abused: Not on file    Forced sexual activity: Not on file  Other Topics Concern  . Not on file  Social History Narrative  . Not on file    Review of Systems  Constitutional: Negative.   HENT: Negative.   Eyes: Negative.   Respiratory: Negative.   Cardiovascular: Negative.   Gastrointestinal: Negative.   Endocrine: Negative.   Genitourinary:       2 lumps in left breast- axilla area  Musculoskeletal: Negative.   Skin: Negative.   Allergic/Immunologic: Negative.   Neurological: Negative.   Hematological: Negative.   Psychiatric/Behavioral: Negative.     PHYSICAL EXAMINATION:    BP 126/80 (BP Location: Right Arm, Patient Position: Sitting, Cuff Size: Large)   Pulse 76   Resp 14   Ht '5\' 5"'  (1.651 m)   LMP 05/02/2006   BMI 48.92 kg/m     General appearance: alert, cooperative and appears stated age   Breasts: normal appearance, no masses or tenderness, No nipple retraction or dimpling, No nipple discharge or bleeding, No axillary or supraclavicular adenopathy.  Left axilla with 2 sebaceous cysts, 0.75 cm.  No evidence of abscess.    Chaperone was present for exam.  ASSESSMENT  Left axillary sebaceous cysts. FH breast and ovarian cancer.  Sister is BRCA negative.   PLAN  No dx mammogram/US needed.  Reassurance given.  FU for routine annual exam and if cysts become bothersome/painful. If this occurs, will need to see dermatology or general surgery for removal.   An After Visit Summary was printed and given to the patient.  ___15___ minutes face to face time of which over 50% was spent in  counseling.

## 2017-12-26 ENCOUNTER — Other Ambulatory Visit: Payer: Self-pay

## 2017-12-26 ENCOUNTER — Encounter: Payer: Self-pay | Admitting: Obstetrics and Gynecology

## 2017-12-26 ENCOUNTER — Ambulatory Visit (INDEPENDENT_AMBULATORY_CARE_PROVIDER_SITE_OTHER): Payer: 59 | Admitting: Obstetrics and Gynecology

## 2017-12-26 VITALS — BP 126/80 | HR 76 | Resp 14 | Ht 65.0 in

## 2017-12-26 DIAGNOSIS — L723 Sebaceous cyst: Secondary | ICD-10-CM

## 2017-12-26 NOTE — Patient Instructions (Signed)
Epidermal Cyst An epidermal cyst is a small, painless lump under your skin. It may be called an epidermal inclusion cyst or an infundibular cyst. The cyst contains a grayish-white, bad-smelling substance (keratin). It is important not to pop epidermal cysts yourself. These cysts are usually harmless (benign), but they can get infected. Symptoms of infection may include:  Redness.  Inflammation.  Tenderness.  Warmth.  Fever.  A grayish-white, bad-smelling substance draining from the cyst.  Pus draining from the cyst.  Follow these instructions at home:  Take over-the-counter and prescription medicines only as told by your doctor.  If you were prescribed an antibiotic, use it as told by your doctor. Do not stop using the antibiotic even if you start to feel better.  Keep the area around your cyst clean and dry.  Wear loose, dry clothing.  Do not try to pop your cyst.  Avoid touching your cyst.  Check your cyst every day for signs of infection.  Keep all follow-up visits as told by your doctor. This is important. How is this prevented?  Wear clean, dry, clothing.  Avoid wearing tight clothing.  Keep your skin clean and dry. Shower or take baths every day.  Wash your body with a benzoyl peroxide wash when you shower or bathe. Contact a health care provider if:  Your cyst has symptoms of infection.  Your condition is not improving or is getting worse.  You have a cyst that looks different from other cysts you have had.  You have a fever. Get help right away if:  Redness spreads from the cyst into the surrounding area. This information is not intended to replace advice given to you by your health care provider. Make sure you discuss any questions you have with your health care provider. Document Released: 05/26/2004 Document Revised: 12/16/2015 Document Reviewed: 02/18/2015 Elsevier Interactive Patient Education  2018 [employer]  

## 2018-01-25 ENCOUNTER — Ambulatory Visit (INDEPENDENT_AMBULATORY_CARE_PROVIDER_SITE_OTHER): Payer: 59 | Admitting: Obstetrics & Gynecology

## 2018-01-25 ENCOUNTER — Other Ambulatory Visit: Payer: Self-pay

## 2018-01-25 ENCOUNTER — Encounter: Payer: Self-pay | Admitting: Obstetrics & Gynecology

## 2018-01-25 VITALS — BP 110/78 | HR 76 | Resp 16 | Ht 65.0 in

## 2018-01-25 DIAGNOSIS — Z01419 Encounter for gynecological examination (general) (routine) without abnormal findings: Secondary | ICD-10-CM

## 2018-01-25 NOTE — Progress Notes (Signed)
57 y.o. [id number] Married White or Caucasian female here for annual exam.  Saw Dr. Edward Jolly 12/26/17 for a sebaceous cyst.  Pt reports this is much smaller now.  Has MMG scheduled for tomorrow.  Is screening.  Doubtful she will need diagnostic.    Denies vaginal bleeding.    PCP: Dr. Cliffton Asters.  Has been her this year.  Had blood work done at that visit.   Patient's last menstrual period was 05/02/2006.          Sexually active: No.  The current method of family planning is post menopausal status.    Exercising: Yes.    bike Smoker:  no  Health Maintenance: Pap:  11/25/16 Neg. HR HPV:neg   07/17/14 neg History of abnormal Pap:  no MMG:  01/25/17 BIRADS2:Benign. Has appt tomorrow  Colonoscopy:  2014  Normal  BMD:   01/16/14 Normal  TDaP:  2018 Pneumonia vaccine(s):  n/a Shingrix:   Discussed with pt today. Hep C testing: 09/07/15 Neg  Screening Labs: PCP   reports that she has never smoked. She has never used smokeless tobacco. She reports that she does not drink alcohol or use drugs.  Past Medical History:  Diagnosis Date  . Abnormal Pap smear    gland. cells -> colpo -> neg endo bx, CIN I  . Hyperlipidemia   . Hypothyroidism   . STD (sexually transmitted disease)    HSV    Past Surgical History:  Procedure Laterality Date  . BIOPSY THYROID     Dr Talmage Nap 5-6 years ago-goiter on meds  . CESAREAN SECTION    . COLPOSCOPY     CIN I, neg endo bx   . GASTRIC BYPASS  5.2005    Current Outpatient Medications  Medication Sig Dispense Refill  . atorvastatin (LIPITOR) 20 MG tablet Take 20 mg by mouth daily.    . Biotin 1000 MCG tablet Take 1,000 mcg by mouth daily.    . Calcium Carbonate (CALCIUM 600 PO) Take 600 mg by mouth daily.     . Coconut Oil 1000 MG CAPS Take by mouth daily.    . diclofenac (VOLTAREN) 75 MG EC tablet as needed.     . Lactobacillus (ACIDOPHILUS PO) Take by mouth daily.    Marland Kitchen levothyroxine (SYNTHROID, LEVOTHROID) 100 MCG tablet Take 200 mcg by mouth daily before  breakfast. on Sunday    . Multiple Vitamins-Minerals (MULTIVITAMIN PO) Take by mouth daily.    . NON FORMULARY OTC all natural leg cramp medication  2 qhs    . Omega-3 Fatty Acids (FISH OIL) 1200 MG CAPS Take by mouth daily.    . ranitidine (ZANTAC) 300 MG tablet Take 300 mg by mouth daily.  1  . valACYclovir (VALTREX) 500 MG tablet TAKE ONE TABLET BY MOUTH TWICE DAILY FOR 3 DAYS STARTING WITH ONSET OF SYMPTOMS 30 tablet 1  . VITAMIN D, CHOLECALCIFEROL, PO Take 4,000 Int'l Units by mouth daily.     No current facility-administered medications for this visit.     Family History  Problem Relation Age of Onset  . Osteoporosis Mother   . Lung cancer Mother        non small cell   . Deep vein thrombosis Mother   . Cervical cancer Maternal Grandmother   . Heart attack Paternal Grandfather   . Diabetes Maternal Grandfather   . Breast cancer Sister 56  . Thyroid disease Sister   . Thyroid cancer Unknown        maternal  cousin    Review of Systems  Musculoskeletal: Positive for myalgias.  All other systems reviewed and are negative.   Exam:   BP 110/78 (BP Location: Right Arm, Patient Position: Sitting, Cuff Size: Large)   Pulse 76   Resp 16   Ht 5\' 5"  (1.651 m)   LMP 05/02/2006   BMI 48.92 kg/m    Height: 5\' 5"  (165.1 cm)  Ht Readings from Last 3 Encounters:  01/25/18 5\' 5"  (1.651 m)  12/26/17 5\' 5"  (1.651 m)  11/25/16 5\' 5"  (1.651 m)    General appearance: alert, cooperative and appears stated age Head: Normocephalic, without obvious abnormality, atraumatic Neck: no adenopathy, supple, symmetrical, trachea midline and thyroid normal to inspection and palpation Lungs: clear to auscultation bilaterally Breasts: normal appearance, no masses or tenderness Heart: regular rate and rhythm Abdomen: soft, non-tender; bowel sounds normal; no masses,  no organomegaly Extremities: extremities normal, atraumatic, no cyanosis or edema Skin: Skin color, texture, turgor normal.  No rashes or lesions Lymph nodes: Cervical, supraclavicular, and axillary nodes normal. No abnormal inguinal nodes palpated Neurologic: Grossly normal   Pelvic: External genitalia:  Hypopigmentation form perineal body down towards rectum.  Has been biopsied in the past.              Urethra:  normal appearing urethra with no masses, tenderness or lesions              Bartholins and Skenes: normal                 Vagina: normal appearing vagina with normal color and discharge, no lesions              Cervix: no lesions              Pap taken: No. Bimanual Exam:  Uterus:  normal size, contour, position, consistency, mobility, non-tender              Adnexa: normal adnexa and no mass, fullness, tenderness               Rectovaginal: Confirms               Anus:  normal sphincter tone, no lesions  Chaperone was present for exam.  A:  Well Woman with normal exam PMP, no HRT Obesity Hypothyroidism H/O HSV H/O breast cancer, diagnosed at age 32 Axillary left sebaceous cyst Biopsy proven lichen sclerosus of vulva  P:   Mammogram guidelines reviewed pap smear neg with neg HR HPV 2018.  Not indicated today. Colonoscopy is UTD Shingrix vaccination discussed Lab work done with Dr. Cliffton Asters return annually or prn

## 2018-02-03 IMAGING — US US SOFT TISSUE HEAD/NECK
1 series · 13 of 25 positions shown · non-contrast
Comparison: Thyroid ultrasound July 24, 2014.

CLINICAL DATA: Left thyroid nodule.

EXAM:
THYROID ULTRASOUND
TECHNIQUE: Ultrasound examination of the thyroid gland and adjacent soft
tissues was performed.

[Series 1: us soft tissue head/neck · 0.06mm/px · 13 of 53 slices shown]
[im 1/53]
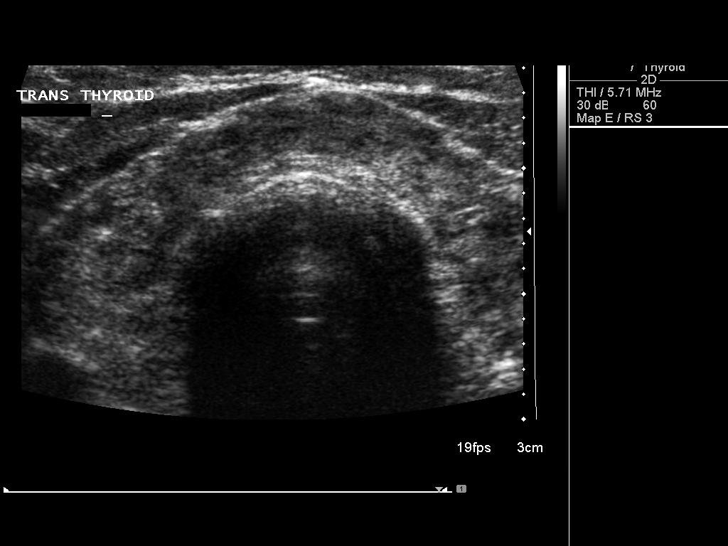
[im 5/53]
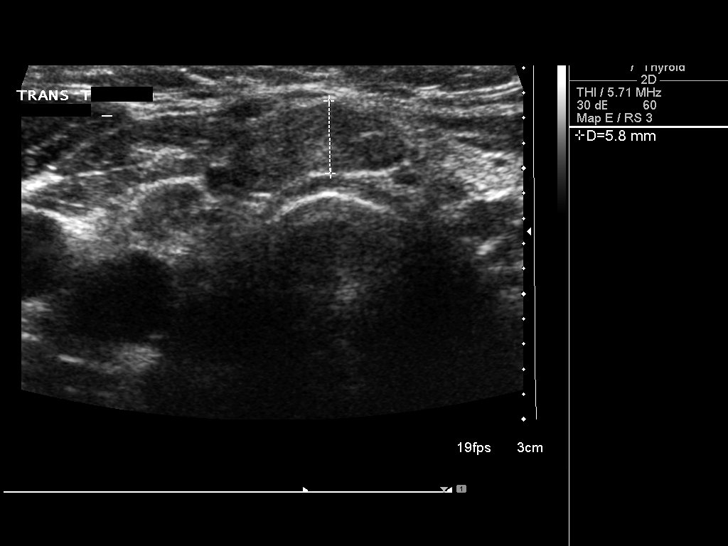
[im 9/53]
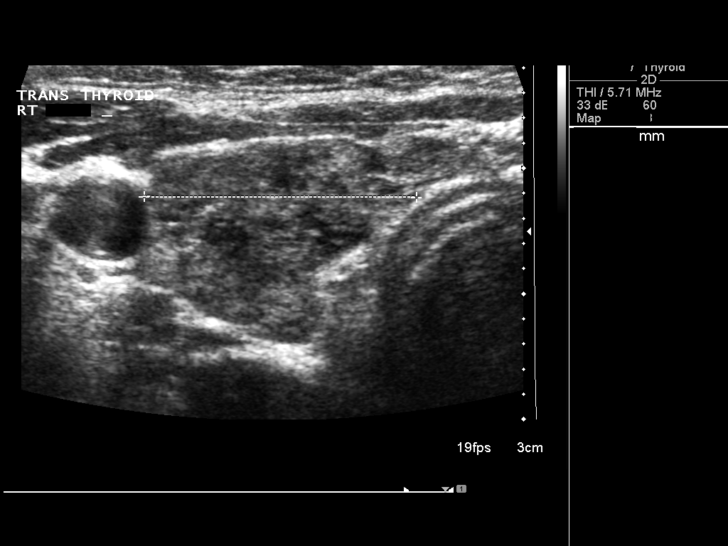
[im 14/53]
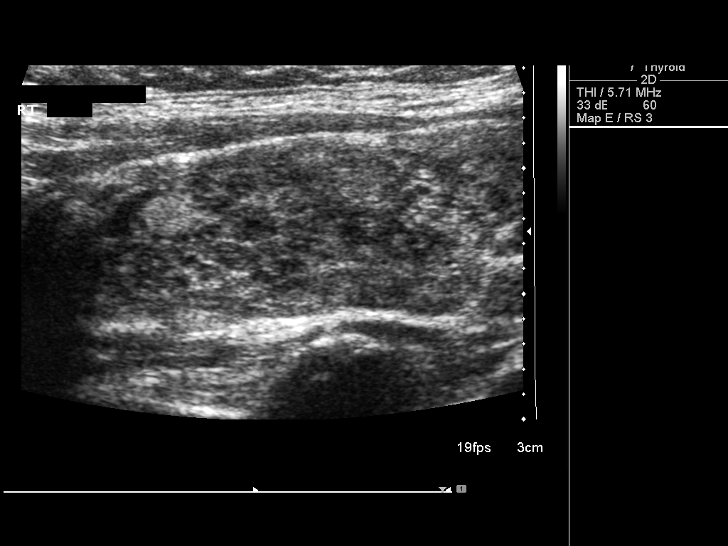
[im 18/53]
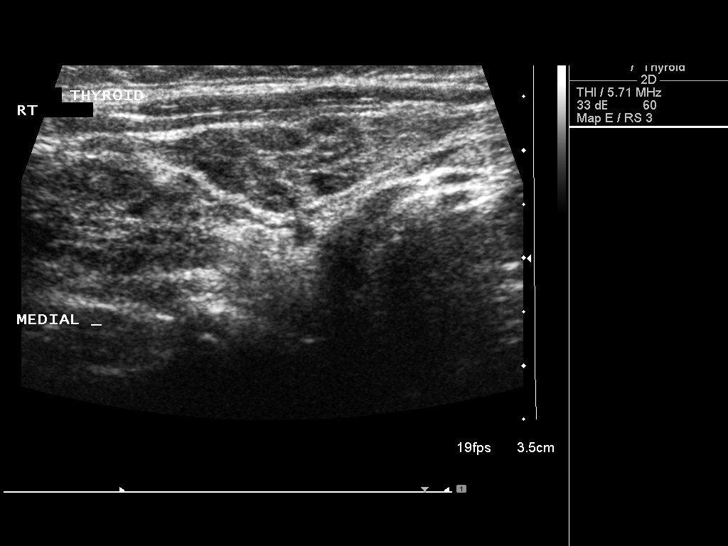
[im 22/53]
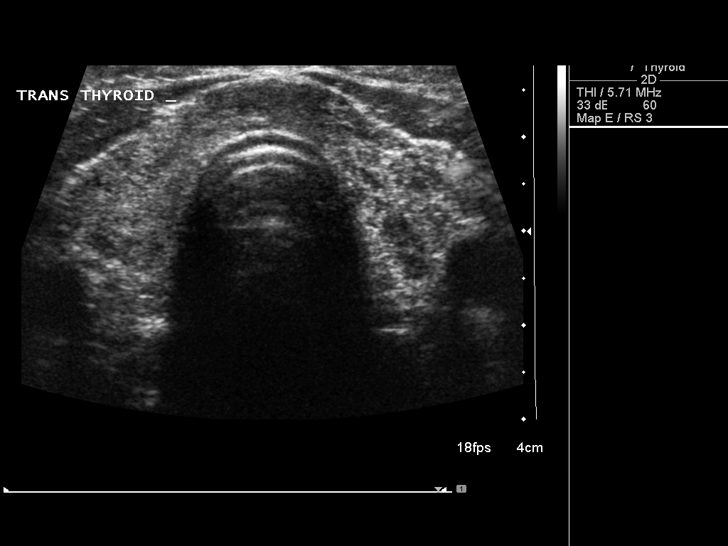
[im 27/53]
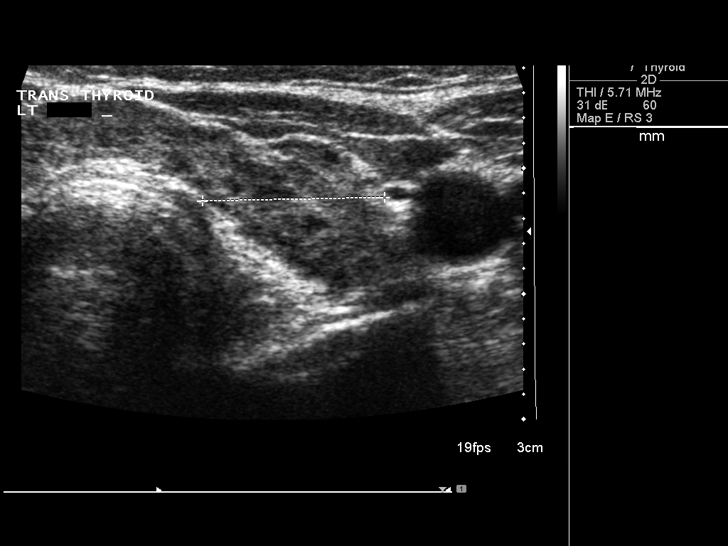
[im 31/53]
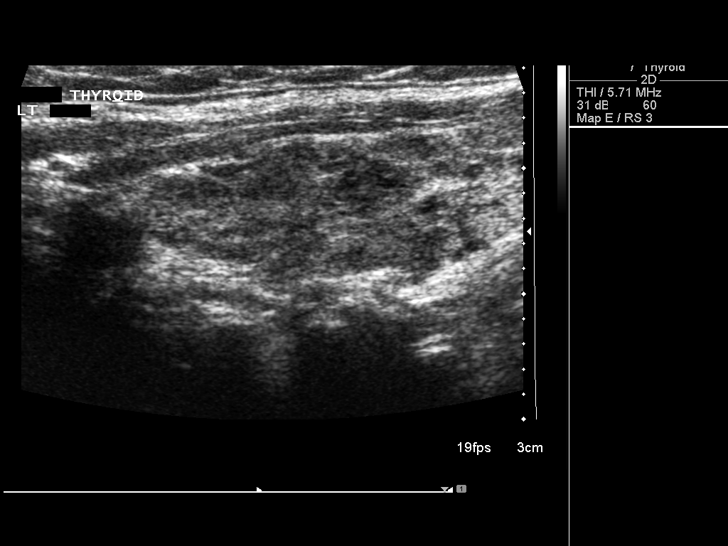
[im 35/53]
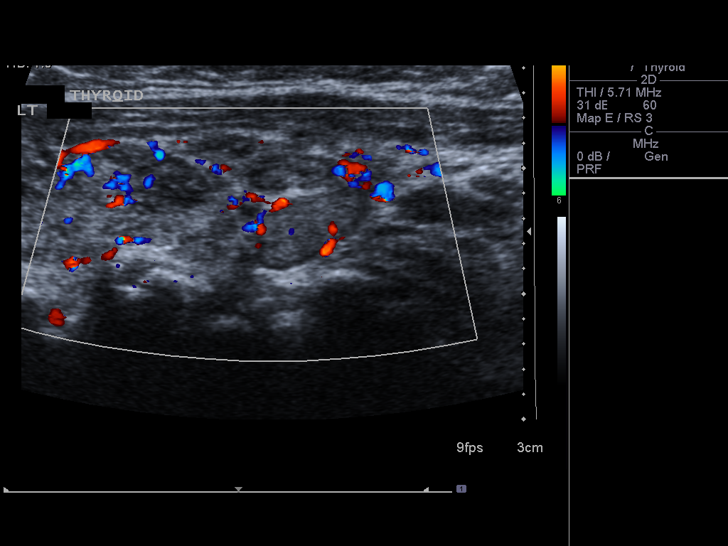
[im 40/53]
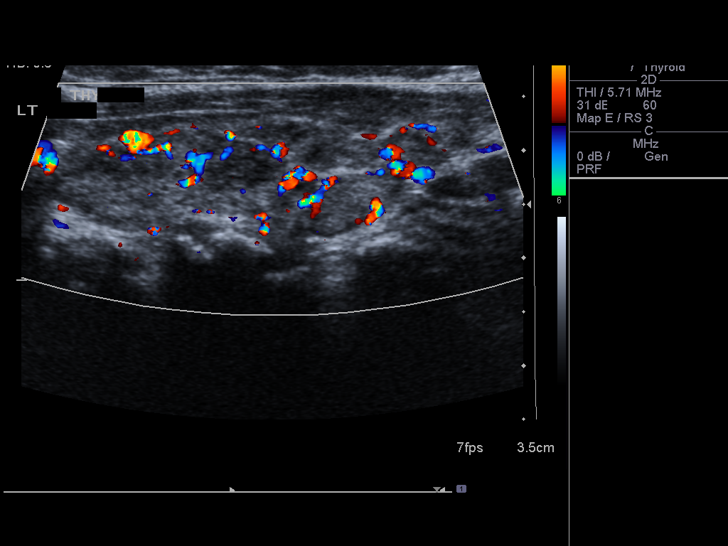
[im 44/53]
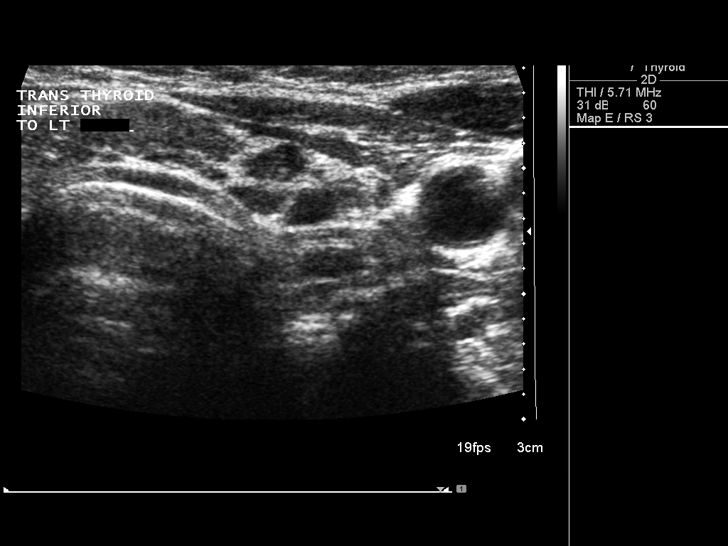
[im 48/53]
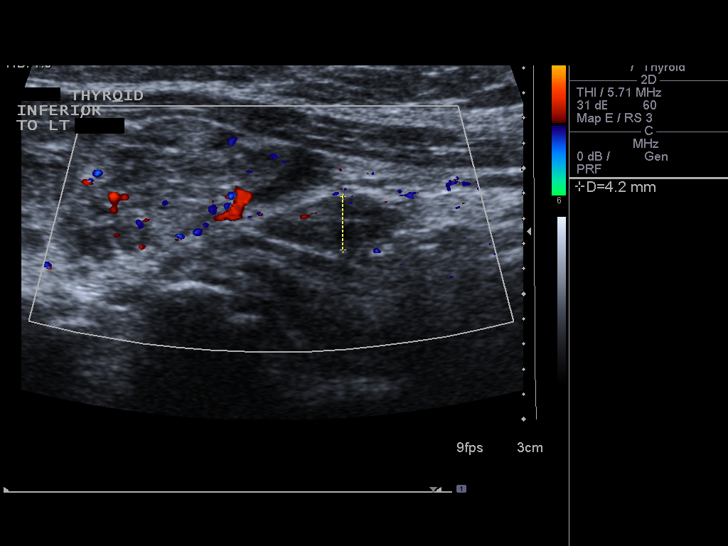
[im 53/53]
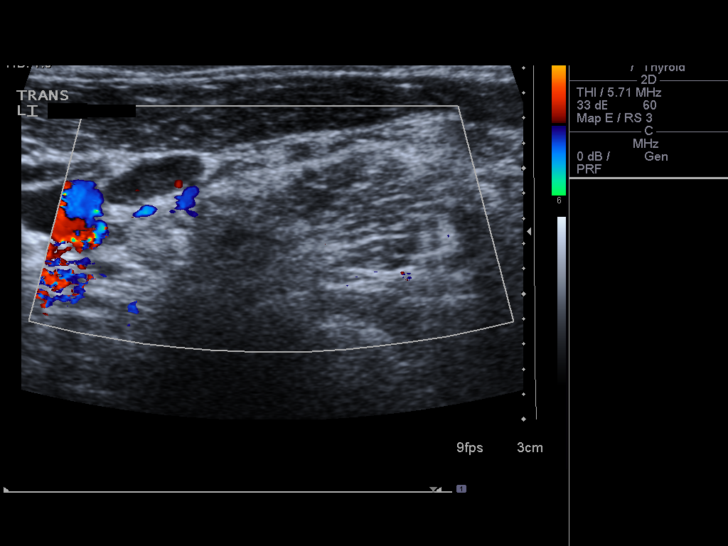

[13 of 25 positions shown; findings below may reference images not displayed]

FINDINGS: Right thyroid lobe

Measurements: 5.8 x 2.2 x 1.6 cm. No nodules visualized. Parenchyma
is heterogeneous in appearance.

Left thyroid lobe

Measurements: 4.4 x 1.5 x 1.3 cm. Parenchyma is heterogeneous in
appearance. Solid nodule or pseudonodule measuring 12 by 6 mm is
noted in lower pole which is stable compared to prior exam.

Isthmus

Thickness: 6 mm.  No nodules visualized.

Lymphadenopathy

No significantly enlarged adenopathy is noted.
IMPRESSION: Diffusely heterogeneous appearance of thyroid gland is noted, with
stable 1.2 cm probable solid nodule seen in lower pole of left
thyroid lobe. Findings do not meet current SRU consensus criteria
for biopsy. Follow-up by clinical exam is recommended. If patient
has known risk factors for thyroid carcinoma, consider follow-up
ultrasound in 12 months. If patient is clinically hyperthyroid,
consider nuclear medicine thyroid uptake and scan.Reference:
Management of Thyroid Nodules Detected at US: Society of
Radiologists in Ultrasound Consensus Conference Statement. Radiology

## 2018-09-19 IMAGING — US US SOFT TISSUE HEAD/NECK
1 series · 13 of 25 positions shown · non-contrast
Comparison: 07/28/2015, 07/24/2014 and additional studies dating
back to 2446.

CLINICAL DATA: Prior ultrasound follow-up. History of multinodular
goiter with prior biopsy of 1.4 cm right mid thyroid nodule on
06/06/2007 demonstrating non neoplastic goiter.

EXAM:
THYROID ULTRASOUND
TECHNIQUE: Ultrasound examination of the thyroid gland and adjacent soft
tissues was performed.

[Series 1: us soft tissue head/neck · 0.08mm/px · 13 of 52 slices shown]
[im 1/52]
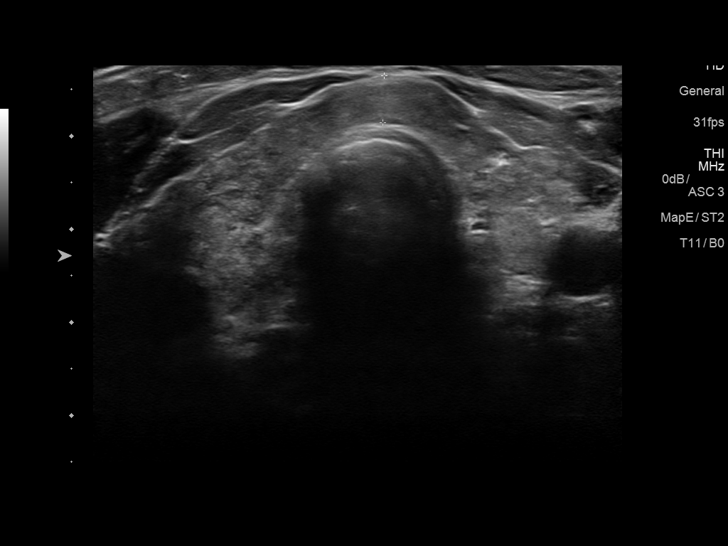
[im 5/52]
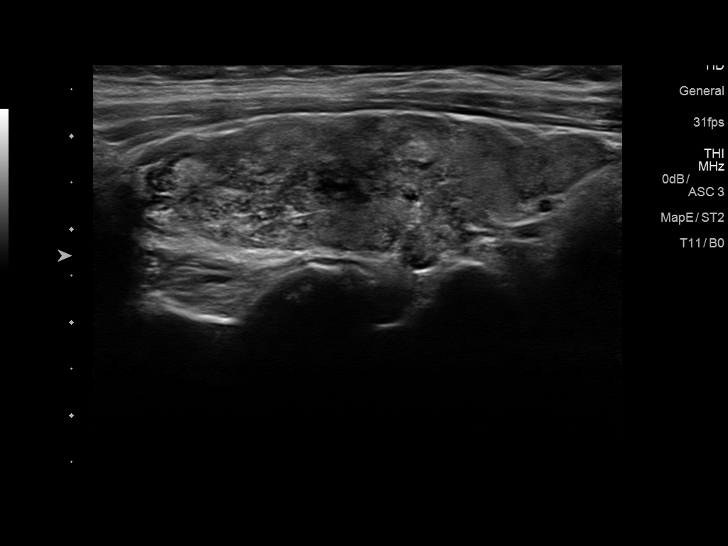
[im 9/52]
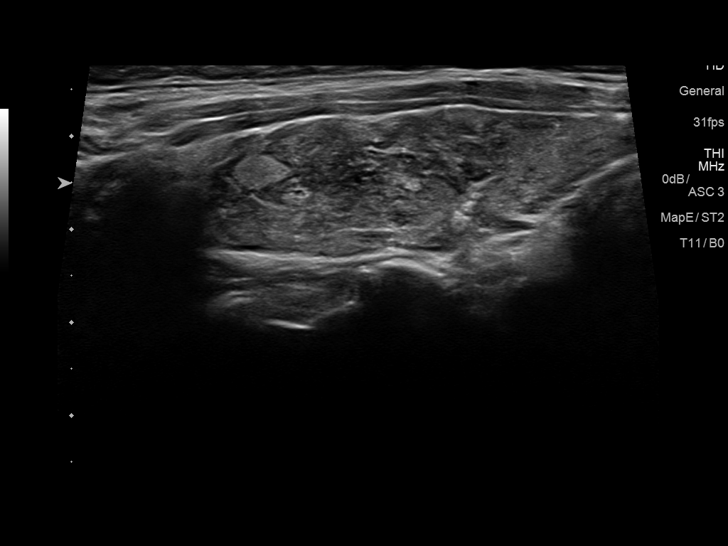
[im 13/52]
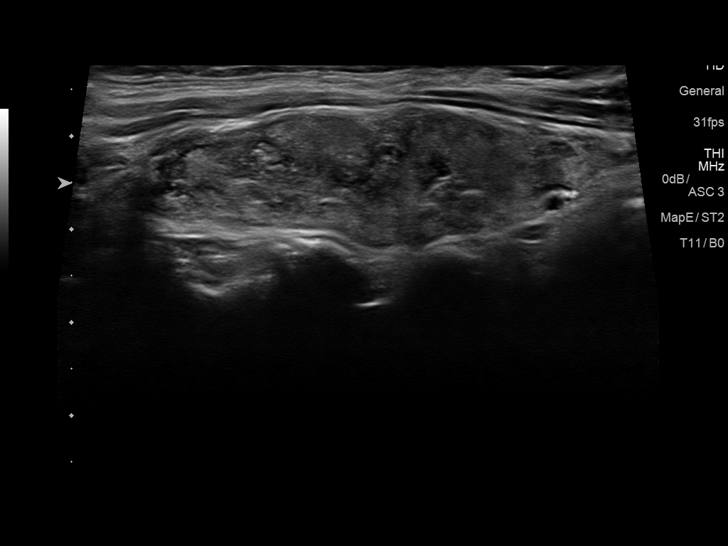
[im 18/52]
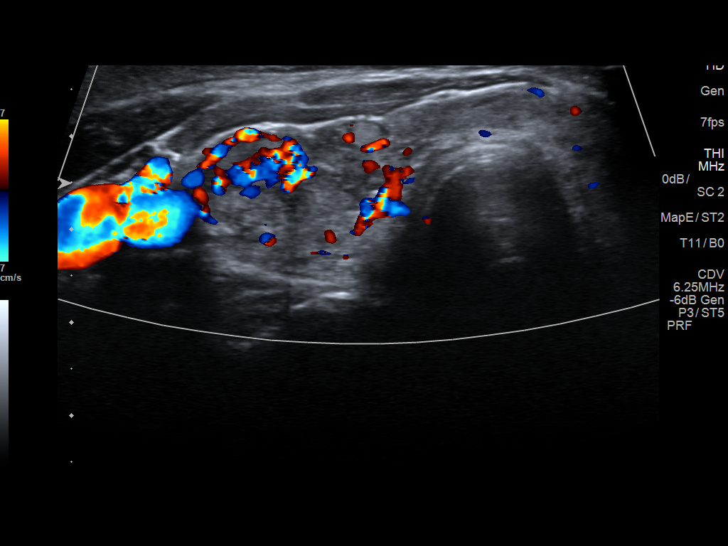
[im 22/52]
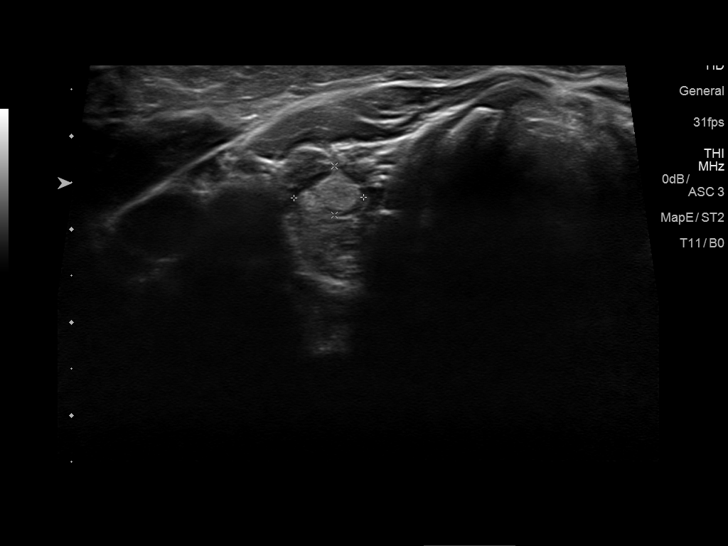
[im 26/52]
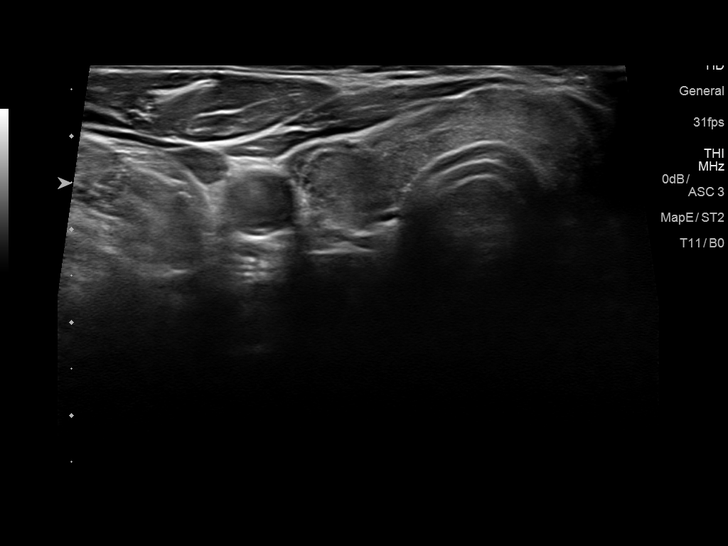
[im 30/52]
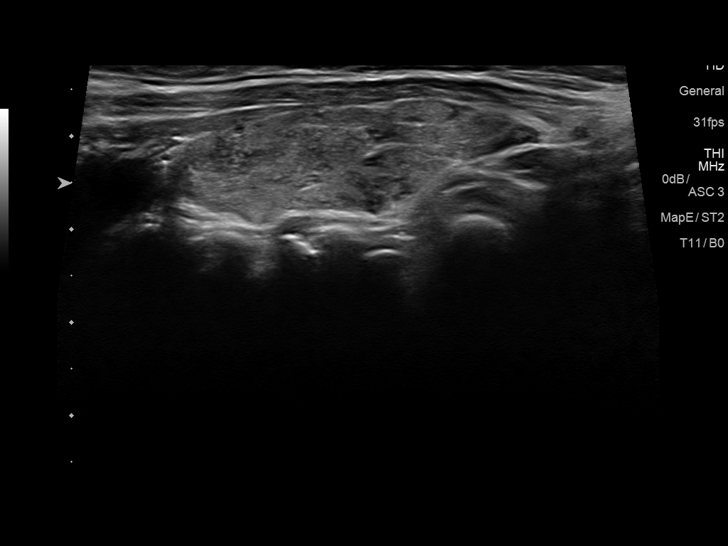
[im 35/52]
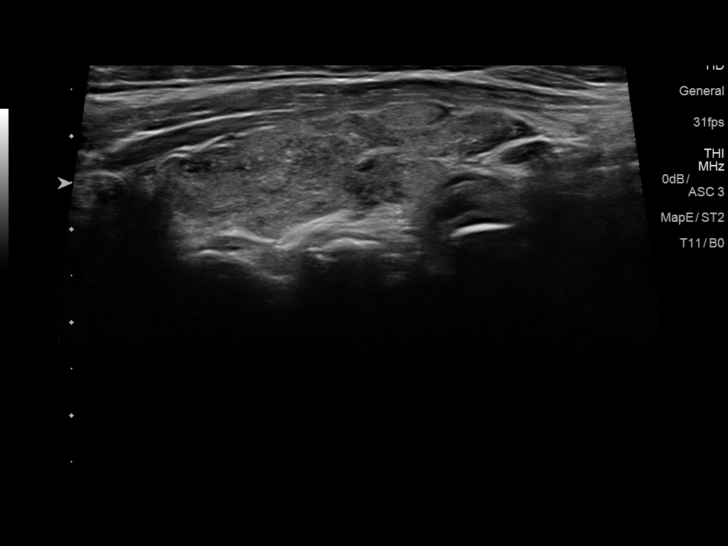
[im 39/52]
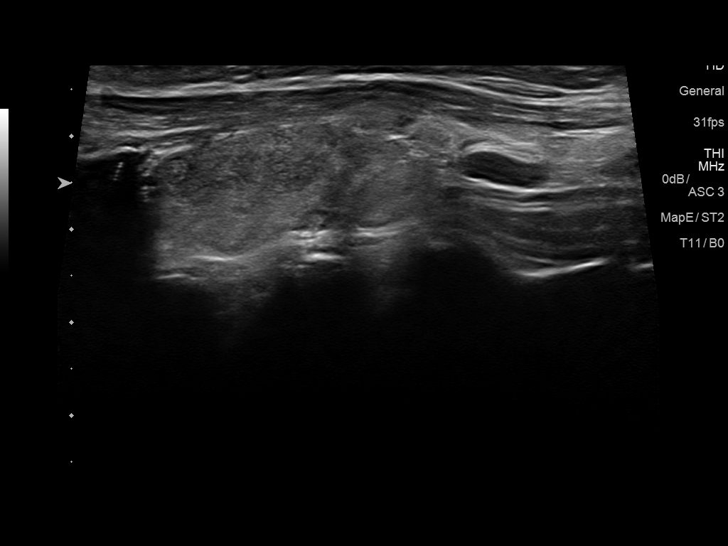
[im 43/52]
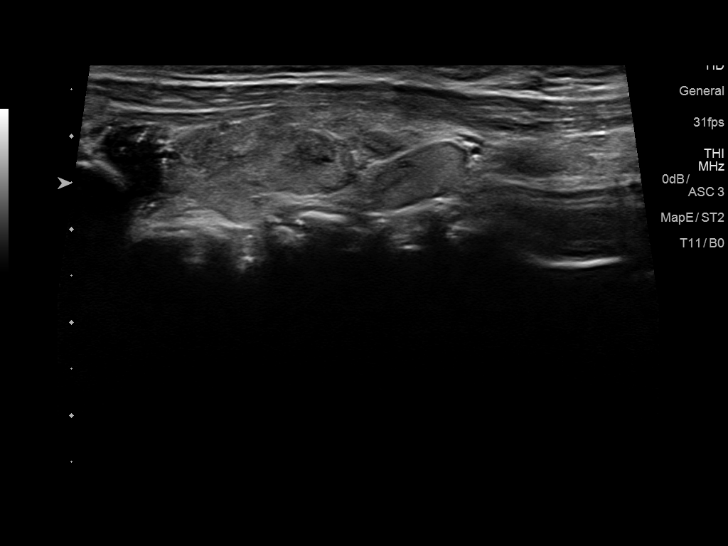
[im 47/52]
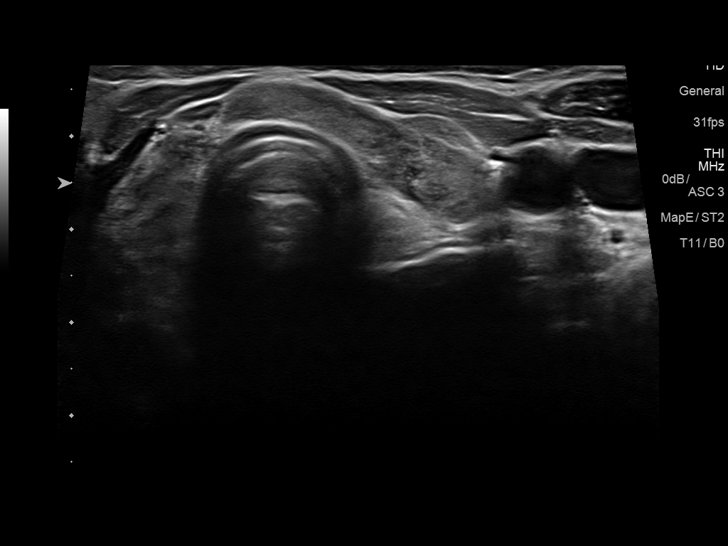
[im 52/52]
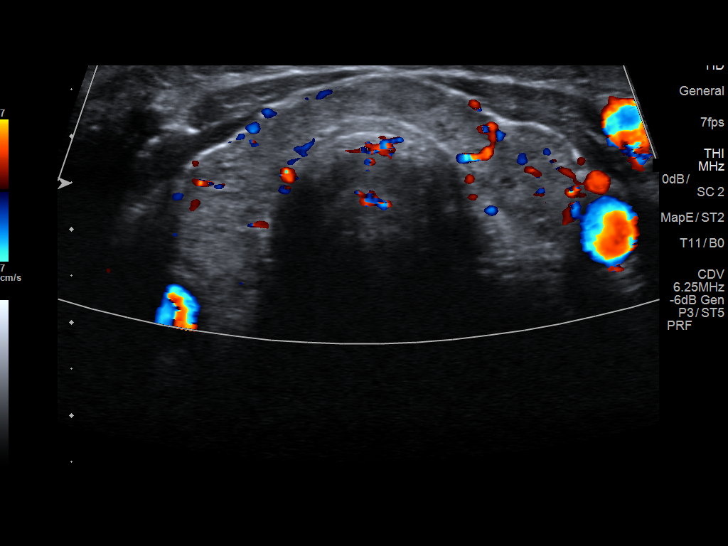

[13 of 25 positions shown; findings below may reference images not displayed]

FINDINGS: Parenchymal Echotexture: Moderately heterogenous

Isthmus: 0.6 cm

Right lobe: 5.2 x 1.6 x 1.7 cm

Left lobe: 3.9 x 1.3 x 1.3 cm

_________________________________________________________

Estimated total number of nodules >/= 1 cm: 1

Number of spongiform nodules >/=  2 cm not described below (TR1): 0

Number of mixed cystic and solid nodules >/= 1.5 cm not described
below (TR2): 0

_________________________________________________________

Nodule # 1:

Prior biopsy: No

Location: Left; Inferior

Maximum size: 1.2 cm; Other 2 dimensions: 0.5 x 0.9 cm, previously,
1.2 cm

Composition: solid/almost completely solid (2)

Echogenicity: isoechoic (1)

Shape: not taller-than-wide (0)

Margins: smooth (0)

Echogenic foci: none (0)

ACR TI-RADS total points: 3.

ACR TI-RADS risk category:  TR3 (3 points).

Significant change in size (>/= 20% in two dimensions and minimal
increase of 2 mm): No

Change in features: No

Change in ACR TI-RADS risk category: No

ACR TI-RADS recommendations:

Given size (<1.4 cm) and appearance, this nodule does NOT meet
TI-RADS criteria for biopsy or dedicated follow-up.

_________________________________________________________

No other discrete nodules identified.
IMPRESSION: Stable area of nodularity in the inferior left lobe measuring 1.2 cm
in greatest diameter. This does not meet criteria for fine-needle
aspiration or further dedicated follow-up.

The above is in keeping with the ACR TI-RADS recommendations - [HOSPITAL] 1422;[DATE].

## 2019-02-08 ENCOUNTER — Encounter: Payer: Self-pay | Admitting: Obstetrics & Gynecology

## 2019-05-24 ENCOUNTER — Ambulatory Visit: Payer: 59 | Admitting: Obstetrics & Gynecology

## 2019-05-31 ENCOUNTER — Other Ambulatory Visit: Payer: Self-pay

## 2019-06-04 ENCOUNTER — Other Ambulatory Visit: Payer: Self-pay

## 2019-06-04 ENCOUNTER — Other Ambulatory Visit (HOSPITAL_COMMUNITY)
Admission: RE | Admit: 2019-06-04 | Discharge: 2019-06-04 | Disposition: A | Discharge status: Home / Self Care | Payer: 59 | Source: Ambulatory Visit | Attending: Obstetrics & Gynecology | Admitting: Obstetrics & Gynecology

## 2019-06-04 ENCOUNTER — Ambulatory Visit (INDEPENDENT_AMBULATORY_CARE_PROVIDER_SITE_OTHER): Payer: 59 | Admitting: Obstetrics & Gynecology

## 2019-06-04 ENCOUNTER — Encounter: Payer: Self-pay | Admitting: Obstetrics & Gynecology

## 2019-06-04 VITALS — BP 122/80 | HR 88 | Temp 97.4°F | Resp 12 | Ht 64.5 in | Wt 280.0 lb

## 2019-06-04 DIAGNOSIS — L900 Lichen sclerosus et atrophicus: Secondary | ICD-10-CM | POA: Insufficient documentation

## 2019-06-04 DIAGNOSIS — Z124 Encounter for screening for malignant neoplasm of cervix: Secondary | ICD-10-CM | POA: Diagnosis present

## 2019-06-04 DIAGNOSIS — Z01419 Encounter for gynecological examination (general) (routine) without abnormal findings: Secondary | ICD-10-CM | POA: Diagnosis not present

## 2019-06-04 MED ORDER — CLOBETASOL PROPIONATE 0.05 % EX OINT: 1.0000 "application " | TOPICAL_OINTMENT | Freq: Two times a day (BID) | CUTANEOUS | 1 refills | Status: DC

## 2019-06-04 NOTE — Progress Notes (Signed)
59 y.o. [id number] Married White or Caucasian female here for annual exam.  Doing well.  Has lightened hair and this looks really lovely on her.    Denies vaginal bleeding.    Having knee pain this year.  She has steroid injections but this didn't really help.  Is working on weight loss.  She is down over 20 pounds.    Patient's last menstrual period was 05/02/2006.          Sexually active: No.  The current method of family planning is post menopausal status.    Exercising: Yes.    bike Smoker:  no  Health Maintenance: Pap:  11/25/16 Neg. HR HPV:neg              07/17/14 neg History of abnormal Pap:  no MMG:  October 2020 -- [facility].  This is not in Epic today.  Pt thinks she received some information about this. Colonoscopy:  2014  Normal BMD:   01/16/14 Normal.  Had one last year at [facility].   TDaP:  2018 Pneumonia vaccine(s):  n/a Shingrix:   Discussed with pt Hep C testing: 09/07/15 neg Screening Labs: PCP   reports that she has never smoked. She has never used smokeless tobacco. She reports that she does not drink alcohol or use drugs.  Past Medical History:  Diagnosis Date  . Abnormal Pap smear    gland. cells -> colpo -> neg endo bx, CIN I  . Hyperlipidemia   . Hypothyroidism   . STD (sexually transmitted disease)    HSV    Past Surgical History:  Procedure Laterality Date  . CESAREAN SECTION    . GASTRIC BYPASS  5.2005    Current Outpatient Medications  Medication Sig Dispense Refill  . atorvastatin (LIPITOR) 20 MG tablet Take 20 mg by mouth daily.    . Biotin 1000 MCG tablet Take 1,000 mcg by mouth daily.    . Calcium Carbonate (CALCIUM 600 PO) Take 600 mg by mouth daily.     . Coconut Oil 1000 MG CAPS Take by mouth daily.    . Lactobacillus (ACIDOPHILUS PO) Take by mouth daily.    Marland Kitchen levothyroxine (SYNTHROID, LEVOTHROID) 100 MCG tablet Take 200 mcg by mouth daily before breakfast. on Sunday    . Multiple Vitamins-Minerals (MULTIVITAMIN PO) Take by mouth daily.     . NON FORMULARY OTC all natural leg cramp medication  2 qhs    . Omega-3 Fatty Acids (FISH OIL) 1200 MG CAPS Take by mouth daily.    . traMADol (ULTRAM) 50 MG tablet Take 100 mg by mouth 3 (three) times daily as needed.    . valACYclovir (VALTREX) 500 MG tablet TAKE ONE TABLET BY MOUTH TWICE DAILY FOR 3 DAYS STARTING WITH ONSET OF SYMPTOMS 30 tablet 1  . VITAMIN D, CHOLECALCIFEROL, PO Take 4,000 Int'l Units by mouth daily.     No current facility-administered medications for this visit.    Family History  Problem Relation Age of Onset  . Osteoporosis Mother   . Lung cancer Mother        non small cell   . Deep vein thrombosis Mother   . Cervical cancer Maternal Grandmother   . Heart attack Paternal Grandfather   . Diabetes Maternal Grandfather   . Breast cancer Sister 24  . Thyroid disease Sister   . Thyroid cancer Other        maternal cousin    Review of Systems  All other systems reviewed and  are negative.   Exam:   BP 122/80 (BP Location: Right Arm, Patient Position: Sitting, Cuff Size: Large)   Pulse 88   Temp (!) 97.4 F (36.3 C) (Temporal)   Resp 12   Ht 5' 4.5" (1.638 m)   Wt 280 lb (127 kg)   LMP 05/02/2006   BMI 47.32 kg/m      Height: 5' 4.5" (163.8 cm)  Ht Readings from Last 3 Encounters:  06/04/19 5' 4.5" (1.638 m)  01/25/18 5\' 5"  (1.651 m)  12/26/17 5\' 5"  (1.651 m)    General appearance: alert, cooperative and appears stated age Head: Normocephalic, without obvious abnormality, atraumatic Neck: no adenopathy, supple, symmetrical, trachea midline and thyroid normal to inspection and palpation Lungs: clear to auscultation bilaterally Breasts: normal appearance, no masses or tenderness Heart: regular rate and rhythm Abdomen: soft, non-tender; bowel sounds normal; no masses,  no organomegaly Extremities: extremities normal, atraumatic, no cyanosis or edema Skin: Skin color, texture, turgor normal. No rashes or lesions Lymph nodes: Cervical,  supraclavicular, and axillary nodes normal. No abnormal inguinal nodes palpated Neurologic: Grossly normal   Pelvic: External genitalia:  Significant skin changes from clitoris all the way to rectum, hypopigmentation noted              Urethra:  normal appearing urethra with no masses, tenderness or lesions              Bartholins and Skenes: normal                 Vagina: normal appearing vagina with normal color and discharge, no lesions              Cervix: no lesions              Pap taken: No. Bimanual Exam:  Uterus:  normal size, contour, position, consistency, mobility, non-tender              Adnexa: normal adnexa and no mass, fullness, tenderness               Rectovaginal: Confirms               Anus:  normal sphincter tone, no lesions  Chaperone, Terence Lux, CMA, was present for exam.  A:  Well Woman with normal exam PMP, no HRT Obesity Hypothyroidism H/o HSV H/o breast cancer, diagnosed at age 18 H/o vulvar sclerosus, biopsy proven Possible hx of CIN I, pt doesn't remember but this is in her history  P:   Mammogram guidelines reviewed.  Will call for copy of this from [place]. pap smear with HR HPV obtained today Lab work done with Dr. Dema Severin Shingrix vaccination discussed today Restart topical clobetasol BID and recheck 6 weeks Colonoscopy is UTD Plan BMD this year with MMG.  Pt will scheduled return annually or prn

## 2019-06-04 NOTE — Patient Instructions (Addendum)
Plan to do a bone density with your next mammogram.  Shingles vaccine:  Shingrix  Outpatient Pharmacy at [facility] [address] [place],  [place]  [address] Main: [phone]  Sunday:Closed Monday:7:30 AM - 6:00 PM Tuesday:7:30 AM - 6:00 PM Wednesday:7:30 AM - 6:00 PM Thursday:7:30 AM - 6:00 PM Friday:7:30 AM - 6:00 PM Saturday:8:00 AM - 4:30 PM

## 2019-06-05 LAB — CYTOLOGY - PAP
Comment: NEGATIVE
Diagnosis: NEGATIVE
High risk HPV: NEGATIVE

## 2019-06-06 ENCOUNTER — Telehealth: Payer: Self-pay

## 2019-06-06 NOTE — Telephone Encounter (Signed)
-----   Message from [name], MD sent at 06/04/2019 10:08 AM EST ----- Regarding: 6 week recheck Pt needs 6 week recheck.  Front was busy when she checked out.    [name]

## 2019-06-06 NOTE — Telephone Encounter (Signed)
Patient returned call. 6 week recheck scheduled for 3/16 at 4pm

## 2019-06-06 NOTE — Telephone Encounter (Signed)
Tried calling patient to schedule 6 week recheck. No answer, left message for patient to call our office back to schedule.

## 2019-07-16 ENCOUNTER — Encounter: Payer: Self-pay | Admitting: Obstetrics & Gynecology

## 2019-07-16 ENCOUNTER — Ambulatory Visit (INDEPENDENT_AMBULATORY_CARE_PROVIDER_SITE_OTHER): Payer: 59 | Admitting: Obstetrics & Gynecology

## 2019-07-16 ENCOUNTER — Other Ambulatory Visit: Payer: Self-pay

## 2019-07-16 VITALS — BP 130/74 | HR 76 | Temp 97.0°F | Resp 12 | Ht 65.0 in

## 2019-07-16 DIAGNOSIS — L900 Lichen sclerosus et atrophicus: Secondary | ICD-10-CM | POA: Diagnosis not present

## 2019-07-16 NOTE — Progress Notes (Signed)
GYNECOLOGY  VISIT  HPI: 59 y.o. G4P2022 Married White or Caucasian female here for 6 week recheck.  She is doing well and using the steroid regularly.  She does have biopsy proven vulvar lichen sclerosus.  She generally does not have a lot of itching or imitation but vulvar skin showed significant changes from clitoris all the way down to the rectum with hypopigmentation and skin excoriation noted at her exam on 06/04/2019.  She was started back on clobetasol ointment to use twice daily.  She reports she not having any itching or irritation.  She has looked with a mirror and does feel her skin is improved.  She does have a much better sense of what normal tissue now appears like on the vulva versus with the skin looks like about a month ago.  She is to present she does not have more skin irritation or itching when the skin looks as severe as it did in February.  She is not having any issues using the topical ointment but has used one of the tubes that was prescribed due to the amount of areas she is covering.  Denies vaginal bleeding.  She is also not had any vulvar bleeding.  GYNECOLOGIC HISTORY: Patient's last menstrual period was 05/02/2006. Contraception: Postmenopausal Menopausal hormone therapy: none  Patient Active Problem List   Diagnosis Date Noted  . Lichen sclerosus 71/09/2692  . HSV (herpes simplex virus) infection 11/25/2016  . Hypothyroid 11/25/2016  . Thyroid nodule 07/27/2015    Past Medical History:  Diagnosis Date  . Abnormal Pap smear    gland. cells -> colpo -> neg endo bx, CIN I  . Hyperlipidemia   . Hypothyroidism   . STD (sexually transmitted disease)    HSV    Past Surgical History:  Procedure Laterality Date  . CESAREAN SECTION    . GASTRIC BYPASS  5.2005    MEDS:   Current Outpatient Medications on File Prior to Visit  Medication Sig Dispense Refill  . atorvastatin (LIPITOR) 20 MG tablet Take 20 mg by mouth daily.    . Biotin 1000 MCG tablet Take 1,000  mcg by mouth daily.    . Calcium Carbonate (CALCIUM 600 PO) Take 600 mg by mouth daily.     . clobetasol ointment (TEMOVATE) 8.54 % Apply 1 application topically 2 (two) times daily. Apply as directed twice daily 60 g 1  . Coconut Oil 1000 MG CAPS Take by mouth daily.    . Lactobacillus (ACIDOPHILUS PO) Take by mouth daily.    Marland Kitchen levothyroxine (SYNTHROID, LEVOTHROID) 100 MCG tablet Take 200 mcg by mouth daily before breakfast. 180mcg on Sunday    . Multiple Vitamins-Minerals (MULTIVITAMIN PO) Take by mouth daily.    . NON FORMULARY OTC all natural leg cramp medication  2 qhs    . Omega-3 Fatty Acids (FISH OIL) 1200 MG CAPS Take by mouth daily.    . traMADol (ULTRAM) 50 MG tablet Take 100 mg by mouth 3 (three) times daily as needed.    . valACYclovir (VALTREX) 500 MG tablet TAKE ONE TABLET BY MOUTH TWICE DAILY FOR 3 DAYS STARTING WITH ONSET OF SYMPTOMS 30 tablet 1  . VITAMIN D, CHOLECALCIFEROL, PO Take 4,000 Int'l Units by mouth daily.     No current facility-administered medications on file prior to visit.    ALLERGIES: Codeine, Erythromycin, and Vicodin [hydrocodone-acetaminophen]  Family History  Problem Relation Age of Onset  . Osteoporosis Mother   . Lung cancer Mother  non small cell   . Deep vein thrombosis Mother   . Cervical cancer Maternal Grandmother   . Heart attack Paternal Grandfather   . Diabetes Maternal Grandfather   . Breast cancer Sister 42  . Thyroid disease Sister   . Thyroid cancer Other        maternal cousin    SH: Married, non-smoker  Review of Systems  All other systems reviewed and are negative.   PHYSICAL EXAMINATION:   Vitals:   07/16/19 1607  BP: 130/74  Pulse: 76  Resp: 12  Temp: (!) 97 F (36.1 C)    General appearance: alert, cooperative and appears stated age Lymph:  no inguinal LAD noted  Pelvic: External genitalia: Significantly improved skin from 06/04/2019 visit with improved coloration of skin, decreased erythema, resolution  of excoriation.  No ulcerations noted.              Urethra:  normal appearing urethra with no masses, tenderness or lesions              Bartholins and Skenes: normal                 Vagina: normal appearing vagina with normal color and discharge, no lesions               Anus:  No visible skin lesions  Chaperone, Zenovia Jordan, CMA, was present for exam.  Assessment: Lichen sclerosis, much improved with topical steroid use twice daily  Plan: Due to the significant improvement her skin, she is going to decrease steroid use nightly for 2 more weeks and then will taper off by using it nightly every other night for an additional 2 weeks.  Then have advised her to look with a mirror every few months and if she starts having skin changes, she should let me know and also restart the steroid ointment twice daily twice daily.  We also discussed topical products that might be contributing including toilet paper.  She uses Charmin so she is going to finish what she has an purchased something that is less fragranced.  Because of increased risk of vulvar cancer (approx 5% increased risk or less) even with asymptomatic symptoms, importance of treatment reviewed.  ~25 minutes total time spent with pt.

## 2019-07-16 NOTE — Patient Instructions (Addendum)
Decrease steroid to nightly for 2 more weeks.  Then use approximately every other night for 2 more weeks.  Then, just look every 2-3 months and make sure there aren't a lot of new changes.

## 2020-08-28 ENCOUNTER — Ambulatory Visit: Payer: 59

## 2022-02-21 ENCOUNTER — Encounter (HOSPITAL_BASED_OUTPATIENT_CLINIC_OR_DEPARTMENT_OTHER): Payer: Self-pay | Admitting: Obstetrics & Gynecology

## 2023-05-31 ENCOUNTER — Ambulatory Visit (INDEPENDENT_AMBULATORY_CARE_PROVIDER_SITE_OTHER): Payer: 59 | Admitting: Orthopaedic Surgery

## 2023-05-31 ENCOUNTER — Other Ambulatory Visit (INDEPENDENT_AMBULATORY_CARE_PROVIDER_SITE_OTHER): Payer: 59

## 2023-05-31 VITALS — Ht 65.0 in | Wt 288.0 lb

## 2023-05-31 DIAGNOSIS — M25552 Pain in left hip: Secondary | ICD-10-CM

## 2023-05-31 DIAGNOSIS — M1612 Unilateral primary osteoarthritis, left hip: Secondary | ICD-10-CM | POA: Insufficient documentation

## 2023-05-31 NOTE — Progress Notes (Signed)
The patient is a very pleasant 63 year old female who was sent to me to discuss hip replacement surgery for her left hip.  She is someone who is on a weight loss journey and her highest weight she was 308 pounds.  She is lost about 25 pounds or so since Thanksgiving and her weight today in the office is 288.  It does give her a BMI of almost 48 though.  She has debilitating left hip pain that is on a daily basis.  She also has debilitating right knee pain and known arthritis of the right knee.  She does ambulate using a rolling walker.  She takes Tylenol arthritis as well as hydrocodone.  Her primary care physician is working with her on a weight loss journey.  She is not a diabetic and she is not on blood thinning medications.  At this point her hip pain and knee pain are detrimentally affecting her mobility, her quality of life and actives daily living.  On exam I did have her lay in a supine position.  She does have a large soft tissue envelope around her left hip and thigh.  She also has a large soft tissue envelope around her right and left knees.  X-rays of the pelvis and the left hip shows severe end-stage arthritis of the left hip with bone-on-bone wear and complete loss of the joint space.  There are osteophytes around the hip as well.  We had a long and thorough discussion about joint replacement surgery and the difficulties of joint replacement surgery as well as the risk and benefits of operating on patients with a high BMI.  I really would feel more comfortable with her losing more weight before proceeding with any type of surgery given the high complication rate and even the complications that I seen in my own patients when I have performed surgery on patients with a BMI of over 45.  She understands that most of our colleagues around here will not perform any joint replacement surgery until someone's BMI is below 40.  If it is not an insurance issue, I would like to go on what I feel comfortable  is a Careers adviser and clinical exam.  She understands this as well.  I did give her handout about hip replacement surgery.  And had a good discussion with her about things.  We will see her back in 3 months and I would like a new weight and BMI calculation at that visit and we can still discuss things further in terms of when it would be safe to proceed with a joint replacement.

## 2023-08-30 ENCOUNTER — Ambulatory Visit: Payer: 59 | Admitting: Orthopaedic Surgery

## 2023-10-18 ENCOUNTER — Encounter: Payer: Self-pay | Admitting: Orthopaedic Surgery

## 2023-10-18 ENCOUNTER — Ambulatory Visit (INDEPENDENT_AMBULATORY_CARE_PROVIDER_SITE_OTHER): Admitting: Orthopaedic Surgery

## 2023-10-18 VITALS — Ht 65.0 in | Wt 257.0 lb

## 2023-10-18 DIAGNOSIS — M25552 Pain in left hip: Secondary | ICD-10-CM

## 2023-10-18 DIAGNOSIS — M1612 Unilateral primary osteoarthritis, left hip: Secondary | ICD-10-CM | POA: Diagnosis not present

## 2023-10-18 NOTE — Progress Notes (Signed)
 The patient is well-known to me.  She is a 63 year old female with debilitating and stage arthritis involving her left hip this been seen on plain films and clinical exam findings.  Her x-rays show bone-on-bone wear of that hip.  She does ambulate with a rolling walker and has severe daily left hip pain that is detrimentally affecting her mobility, her quality of life and her actives daily living.  She is in need of left hip replacement surgery.  At her last visit in January her BMI was over 45.  She is lost 30 pounds since I saw her and her BMI is down down to 42.77.  She is not a diabetic.  I was able to review all of her medications and past medical history within epic.  We have gone over hip replacement surgery for her in the past.  Today I had her lay supine on the exam table.  I was able to examine the soft tissue plane for getting to her left hip and do feel at this point is reasonable to proceed with hip replacement surgery given the fact that she is continuing her weight loss journey and I believe she will continue to lose weight.  We had a long thorough discussion about the risks and benefits of the surgery.  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  She would likely need to be out of work maybe 4 to 6 weeks depending on her recovery.  I think this would help her significantly given the severity of her pain and the severity of her arthritis of that left hip.  She will continue her weight loss journey in the interim and we will work on getting her set up for surgery in the near future.  She knows to stop the weight loss medication at least a week or more before surgery.

## 2023-11-12 ENCOUNTER — Other Ambulatory Visit: Payer: Self-pay | Admitting: Physician Assistant

## 2023-11-12 DIAGNOSIS — Z01818 Encounter for other preprocedural examination: Secondary | ICD-10-CM

## 2023-11-13 ENCOUNTER — Telehealth: Payer: Self-pay | Admitting: Orthopaedic Surgery

## 2023-11-13 NOTE — Telephone Encounter (Signed)
 Pt submitted medical release form, Short term forms, and 20.00 payment

## 2023-11-14 NOTE — Telephone Encounter (Signed)
 Received

## 2023-11-28 NOTE — Pre-Procedure Instructions (Signed)
 Surgical Instructions   Your procedure is scheduled on December 05, 2023. Report to [facility] Main Entrance A at 5:30 A.M., then check in with the Admitting office. Any questions or running late day of surgery: call [phone]  Questions prior to your surgery date: call [phone], Monday-Friday, 8am-4pm. If you experience any cold or flu symptoms such as cough, fever, chills, shortness of breath, etc. between now and your scheduled surgery, please notify us  at the above number.     Remember:  Do not eat after midnight the night before your surgery  You may drink clear liquids until 4:30 AM the morning of your surgery.   Clear liquids allowed are: Water, Non-Citrus Juices (without pulp), Carbonated Beverages, Clear Tea (no milk, honey, etc.), Black Coffee Only (NO MILK, CREAM OR POWDERED CREAMER of any kind), and Gatorade.  Patient Instructions  The night before surgery:  No food after midnight. ONLY clear liquids after midnight  The day of surgery (if you do NOT have diabetes):  Drink ONE (1) Pre-Surgery Clear Ensure by 4:30 AM the morning of surgery. Drink in one sitting. Do not sip.  This drink was given to you during your hospital  pre-op appointment visit.  Nothing else to drink after completing the  Pre-Surgery Clear Ensure.         If you have questions, please contact your surgeon's office.    Take these medicines the morning of surgery with A SIP OF WATER: acetaminophen (TYLENOL)  atorvastatin (LIPITOR)  HYDROcodone-acetaminophen (NORCO)  levothyroxine (SYNTHROID, LEVOTHROID)    STOP taking your tirzepatide (ZEPBOUND) one week prior to surgery. DO NOT take any doses after July 28th.   One week prior to surgery, STOP taking any Aspirin (unless otherwise instructed by your surgeon) Aleve, Naproxen, Ibuprofen, Motrin, Advil, Goody's, BC's, all herbal medications, fish oil, and non-prescription vitamins.                     Do NOT Smoke (Tobacco/Vaping) for 24  hours prior to your procedure.  If you use a CPAP at night, you may bring your mask/headgear for your overnight stay.   You will be asked to remove any contacts, glasses, piercing's, hearing aid's, dentures/partials prior to surgery. Please bring cases for these items if needed.    Patients discharged the day of surgery will not be allowed to drive home, and someone needs to stay with them for 24 hours.  SURGICAL WAITING ROOM VISITATION Patients may have no more than 2 support people in the waiting area - these visitors may rotate.   Pre-op nurse will coordinate an appropriate time for 1 ADULT support person, who may not rotate, to accompany patient in pre-op.  Children under the age of 26 must have an adult with them who is not the patient and must remain in the main waiting area with an adult.  If the patient needs to stay at the hospital during part of their recovery, the visitor guidelines for inpatient rooms apply.  Please refer to the [facility] website for the visitor guidelines for any additional information.   If you received a COVID test during your pre-op visit  it is requested that you wear a mask when out in public, stay away from anyone that may not be feeling well and notify your surgeon if you develop symptoms. If you have been in contact with anyone that has tested positive in the last 10 days please notify you surgeon.      Pre-operative  5 CHG Bathing Instructions   You can play a key role in reducing the risk of infection after surgery. Your skin needs to be as free of germs as possible. You can reduce the number of germs on your skin by washing with CHG (chlorhexidine gluconate) soap before surgery. CHG is an antiseptic soap that kills germs and continues to kill germs even after washing.   DO NOT use if you have an allergy to chlorhexidine/CHG or antibacterial soaps. If your skin becomes reddened or irritated, stop using the CHG and notify one of our RNs at  [phone].   Please shower with the CHG soap starting 4 days before surgery using the following schedule:     Please keep in mind the following:  DO NOT shave, including legs and underarms, starting the day of your first shower.   You may shave your face at any point before/day of surgery.  Place clean sheets on your bed the day you start using CHG soap. Use a clean washcloth (not used since being washed) for each shower. DO NOT sleep with pets once you start using the CHG.   CHG Shower Instructions:  Wash your face and private area with normal soap. If you choose to wash your hair, wash first with your normal shampoo.  After you use shampoo/soap, rinse your hair and body thoroughly to remove shampoo/soap residue.  Turn the water OFF and apply about 3 tablespoons (45 ml) of CHG soap to a CLEAN washcloth.  Apply CHG soap ONLY FROM YOUR NECK DOWN TO YOUR TOES (washing for 3-5 minutes)  DO NOT use CHG soap on face, private areas, open wounds, or sores.  Pay special attention to the area where your surgery is being performed.  If you are having back surgery, having someone wash your back for you may be helpful. Wait 2 minutes after CHG soap is applied, then you may rinse off the CHG soap.  Pat dry with a clean towel  Put on clean clothes/pajamas   If you choose to wear lotion, please use ONLY the CHG-compatible lotions that are listed below.  Additional instructions for the day of surgery: DO NOT APPLY any lotions, deodorants, cologne, or perfumes.   Do not bring valuables to the hospital. [facility] is not responsible for any belongings/valuables. Do not wear nail polish, gel polish, artificial nails, or any other type of covering on natural nails (fingers and toes) Do not wear jewelry or makeup Put on clean/comfortable clothes.  Please brush your teeth.  Ask your nurse before applying any prescription medications to the skin.     CHG Compatible Lotions   Aveeno Moisturizing  lotion  Cetaphil Moisturizing Cream  Cetaphil Moisturizing Lotion  Clairol Herbal Essence Moisturizing Lotion, Dry Skin  Clairol Herbal Essence Moisturizing Lotion, Extra Dry Skin  Clairol Herbal Essence Moisturizing Lotion, Normal Skin  Curel Age Defying Therapeutic Moisturizing Lotion with Alpha Hydroxy  Curel Extreme Care Body Lotion  Curel Soothing Hands Moisturizing Hand Lotion  Curel Therapeutic Moisturizing Cream, Fragrance-Free  Curel Therapeutic Moisturizing Lotion, Fragrance-Free  Curel Therapeutic Moisturizing Lotion, Original Formula  Eucerin Daily Replenishing Lotion  Eucerin Dry Skin Therapy Plus Alpha Hydroxy Crme  Eucerin Dry Skin Therapy Plus Alpha Hydroxy Lotion  Eucerin Original Crme  Eucerin Original Lotion  Eucerin Plus Crme Eucerin Plus Lotion  Eucerin TriLipid Replenishing Lotion  Keri Anti-Bacterial Hand Lotion  Keri Deep Conditioning Original Lotion Dry Skin Formula Softly Scented  Keri Deep Conditioning Original Lotion, Fragrance Free Sensitive Skin  Formula  Keri Lotion Fast Absorbing Fragrance Free Sensitive Skin Formula  Keri Lotion Fast Absorbing Softly Scented Dry Skin Formula  Keri Original Lotion  Keri Skin Renewal Lotion Keri Silky Smooth Lotion  Keri Silky Smooth Sensitive Skin Lotion  Nivea Body Creamy Conditioning Oil  Nivea Body Extra Enriched Lotion  Nivea Body Original Lotion  Nivea Body Sheer Moisturizing Lotion Nivea Crme  Nivea Skin Firming Lotion  NutraDerm 30 Skin Lotion  NutraDerm Skin Lotion  NutraDerm Therapeutic Skin Cream  NutraDerm Therapeutic Skin Lotion  ProShield Protective Hand Cream  Provon moisturizing lotion  Please read over the following fact sheets that you were given.

## 2023-11-29 ENCOUNTER — Encounter (HOSPITAL_COMMUNITY): Payer: Self-pay

## 2023-11-29 ENCOUNTER — Other Ambulatory Visit: Payer: Self-pay

## 2023-11-29 ENCOUNTER — Encounter (HOSPITAL_COMMUNITY)
Admission: RE | Admit: 2023-11-29 | Discharge: 2023-11-29 | Disposition: A | Discharge status: Home / Self Care | Source: Ambulatory Visit | Attending: Orthopaedic Surgery | Admitting: Orthopaedic Surgery

## 2023-11-29 VITALS — BP 127/48 | HR 71 | Temp 98.5°F | Resp 16 | Ht 65.0 in | Wt 247.8 lb

## 2023-11-29 DIAGNOSIS — Z01812 Encounter for preprocedural laboratory examination: Secondary | ICD-10-CM | POA: Insufficient documentation

## 2023-11-29 DIAGNOSIS — Z01818 Encounter for other preprocedural examination: Secondary | ICD-10-CM

## 2023-11-29 HISTORY — DX: Unspecified osteoarthritis, unspecified site: M19.90

## 2023-11-29 LAB — BASIC METABOLIC PANEL WITH GFR
Anion gap: 8 (ref 5–15)
BUN: 28 mg/dL — ABNORMAL HIGH (ref 8–23)
CO2: 25 mmol/L (ref 22–32)
Calcium: 9.3 mg/dL (ref 8.9–10.3)
Chloride: 103 mmol/L (ref 98–111)
Creatinine, Ser: 0.77 mg/dL (ref 0.44–1.00)
GFR, Estimated: >60 mL/min (ref >60)
Glucose, Bld: 86 mg/dL (ref 70–99)
Potassium: 3.9 mmol/L (ref 3.5–5.1)
Sodium: 136 mmol/L (ref 135–145)

## 2023-11-29 LAB — CBC
HCT: 40.1 % (ref 36.0–46.0)
Hemoglobin: 12.6 g/dL (ref 12.0–15.0)
MCH: 28.9 pg (ref 26.0–34.0)
MCHC: 31.4 g/dL (ref 30.0–36.0)
MCV: 92 fL (ref 80.0–100.0)
Platelets: 258 K/uL (ref 150–400)
RBC: 4.36 MIL/uL (ref 3.87–5.11)
RDW: 14 % (ref 11.5–15.5)
WBC: 5.4 K/uL (ref 4.0–10.5)
nRBC: 0 % (ref 0.0–0.2)

## 2023-11-29 LAB — SURGICAL PCR SCREEN
MRSA, PCR: NEGATIVE
Staphylococcus aureus: NEGATIVE

## 2023-11-29 LAB — TYPE AND SCREEN
ABO/RH(D): A POS
Antibody Screen: NEGATIVE

## 2023-11-29 NOTE — Progress Notes (Signed)
 PCP - Dr. Montie Pizza Cardiologist - Denies  PPM/ICD - Denies Device Orders - n/a Rep Notified - n/a  Chest x-ray - n/a EKG - Denies Stress Test - Denies ECHO - 07/01/2003 Cardiac Cath - Denies  Sleep Study - Denies CPAP - n/a  No DM  Last dose of GLP1 agonist- Last dose of Zepbound 7/27 GLP1 instructions: Pt instructed to not take anymore doses prior to surgery  Blood Thinner Instructions: n/a Aspirin Instructions: n/a  ERAS Protcol - Clear liquids until 0430 morning of surgery PRE-SURGERY Ensure or G2- Ensure given to pt with instructions  COVID TEST- n/a   Anesthesia review: No   Patient denies shortness of breath, fever, cough and chest pain at PAT appointment. Pt denies any respiratory illness/infection in the last two months.   All instructions explained to the patient, with a verbal understanding of the material. Patient agrees to go over the instructions while at home for a better understanding. Patient also instructed to self quarantine after being tested for COVID-19. The opportunity to ask questions was provided.

## 2023-12-04 NOTE — H&P (Signed)
 TOTAL HIP ADMISSION H&P  Patient is admitted for left total hip arthroplasty.  Subjective:  Chief Complaint: left hip pain  HPI: [name], 63 y.o. female, has a history of pain and functional disability in the left hip(s) due to arthritis and patient has failed non-surgical conservative treatments for greater than 12 weeks to include NSAID's and/or analgesics, corticosteriod injections, flexibility and strengthening excercises, use of assistive devices, weight reduction as appropriate, and activity modification.  Onset of symptoms was gradual starting a few years ago with gradually worsening course since that time.The patient noted no past surgery on the left hip(s).  Patient currently rates pain in the left hip at 10 out of 10 with activity. Patient has night pain, worsening of pain with activity and weight bearing, trendelenberg gait, pain that interfers with activities of daily living, and pain with passive range of motion. Patient has evidence of subchondral sclerosis, periarticular osteophytes, and joint space narrowing by imaging studies. This condition presents safety issues increasing the risk of falls.  There is no current active infection.  Patient Active Problem List   Diagnosis Date Noted   Unilateral primary osteoarthritis, left hip 05/31/2023   Lichen sclerosus 06/04/2019   HSV (herpes simplex virus) infection 11/25/2016   Hypothyroid 11/25/2016   Thyroid  nodule 07/27/2015   Past Medical History:  Diagnosis Date   Abnormal Pap smear    gland. cells -> colpo -> neg endo bx, CIN I   Arthritis    Hyperlipidemia    Hypothyroidism    STD (sexually transmitted disease)    HSV    Past Surgical History:  Procedure Laterality Date   CESAREAN SECTION  1996   COLONOSCOPY     GASTRIC BYPASS  08/2003   VAGINAL DELIVERY  1994    No current facility-administered medications for this encounter.   Current Outpatient Medications  Medication Sig Dispense Refill Last  Dose/Taking   acetaminophen  (TYLENOL ) 650 MG CR tablet Take 1,300 mg by mouth in the morning and at bedtime.   Taking   atorvastatin  (LIPITOR) 20 MG tablet Take 20 mg by mouth daily.   Taking   Biotin  1000 MCG tablet Take 1,000 mcg by mouth daily.   Taking   Calcium  Carbonate (CALCIUM  600 PO) Take 600 mg by mouth daily.    Taking   FEROSUL 325 (65 Fe) MG tablet Take 325 mg by mouth daily.   Taking   HYDROcodone-acetaminophen  (NORCO) 10-325 MG tablet Take 1 tablet by mouth 4 (four) times daily.   Taking   Lactobacillus (ACIDOPHILUS PO) Take 1 capsule by mouth 4 (four) times daily as needed (pain).   Taking As Needed   levothyroxine  (SYNTHROID , LEVOTHROID) 100 MCG tablet Take 200-300 mcg by mouth See admin instructions. Take 200 mcg by mouth in the morning and take 300 mcg on Sunday   Taking   Multiple Vitamins-Minerals (MULTIVITAMIN PO) Take 1 tablet by mouth daily.   Taking   tirzepatide (ZEPBOUND) 10 MG/0.5ML Pen Inject 10 mg into the skin every Sunday.   Taking   VITAMIN D , CHOLECALCIFEROL, PO Take 1 tablet by mouth daily.   Taking   clobetasol  ointment (TEMOVATE ) 0.05 % Apply 1 application topically 2 (two) times daily. Apply as directed twice daily (Patient not taking: Reported on 11/27/2023) 60 g 1 Not Taking   traMADol (ULTRAM) 50 MG tablet Take 100 mg by mouth 3 (three) times daily as needed. (Patient not taking: Reported on 11/27/2023)   Not Taking   valACYclovir  (VALTREX ) 500 MG  tablet TAKE ONE TABLET BY MOUTH TWICE DAILY FOR 3 DAYS STARTING WITH ONSET OF SYMPTOMS (Patient not taking: Reported on 11/27/2023) 30 tablet 1 Not Taking   Allergies  Allergen Reactions   Codeine    Erythromycin    Vicodin [Hydrocodone-Acetaminophen ]     Faint, hot and sweaty    Social History   Tobacco Use   Smoking status: Never   Smokeless tobacco: Never  Substance Use Topics   Alcohol use: No    Family History  Problem Relation Age of Onset   Osteoporosis Mother    Lung cancer Mother        non  small cell    Deep vein thrombosis Mother    Cervical cancer Maternal Grandmother    Heart attack Paternal Grandfather    Diabetes Maternal Grandfather    Breast cancer Sister 74   Thyroid  disease Sister    Thyroid  cancer Other        maternal cousin     Review of Systems  Objective:  Physical Exam Vitals reviewed.  Constitutional:      Appearance: Normal appearance. She is obese.  HENT:     Head: Normocephalic and atraumatic.  Eyes:     Extraocular Movements: Extraocular movements intact.     Pupils: Pupils are equal, round, and reactive to light.  Cardiovascular:     Rate and Rhythm: Normal rate and regular rhythm.  Pulmonary:     Effort: Pulmonary effort is normal.     Breath sounds: Normal breath sounds.  Abdominal:     Palpations: Abdomen is soft.  Musculoskeletal:     Cervical back: Normal range of motion and neck supple.     Left hip: Tenderness and bony tenderness present. Decreased range of motion. Decreased strength.  Neurological:     Mental Status: She is alert and oriented to person, place, and time.  Psychiatric:        Behavior: Behavior normal.     Vital signs in last 24 hours:    Labs:   Estimated body mass index is 41.24 kg/m as calculated from the following:   Height as of 11/29/23: 5' 5 (1.651 m).   Weight as of 11/29/23: 112.4 kg.   Imaging Review Plain radiographs demonstrate severe degenerative joint disease of the left hip(s). The bone quality appears to be good for age and reported activity level.      Assessment/Plan:  End stage arthritis, left hip(s)  The patient history, physical examination, clinical judgement of the provider and imaging studies are consistent with end stage degenerative joint disease of the left hip(s) and total hip arthroplasty is deemed medically necessary. The treatment options including medical management, injection therapy, arthroscopy and arthroplasty were discussed at length. The risks and benefits of  total hip arthroplasty were presented and reviewed. The risks due to aseptic loosening, infection, stiffness, dislocation/subluxation,  thromboembolic complications and other imponderables were discussed.  The patient acknowledged the explanation, agreed to proceed with the plan and consent was signed. Patient is being admitted for inpatient treatment for surgery, pain control, PT, OT, prophylactic antibiotics, VTE prophylaxis, progressive ambulation and ADL's and discharge planning.The patient is planning to be discharged home with home health services

## 2023-12-05 ENCOUNTER — Ambulatory Visit (HOSPITAL_COMMUNITY)

## 2023-12-05 ENCOUNTER — Other Ambulatory Visit: Payer: Self-pay

## 2023-12-05 ENCOUNTER — Observation Stay (HOSPITAL_COMMUNITY)
Admission: RE | Admit: 2023-12-05 | Discharge: 2023-12-07 | Disposition: A | Discharge status: Home / Self Care | Attending: Orthopaedic Surgery | Admitting: Orthopaedic Surgery

## 2023-12-05 ENCOUNTER — Encounter (HOSPITAL_COMMUNITY): Payer: Self-pay | Admitting: Orthopaedic Surgery

## 2023-12-05 ENCOUNTER — Encounter (HOSPITAL_COMMUNITY)
Admission: RE | Disposition: A | Discharge status: Home / Self Care | Payer: Self-pay | Source: Home / Self Care | Attending: Orthopaedic Surgery

## 2023-12-05 ENCOUNTER — Observation Stay (HOSPITAL_COMMUNITY)

## 2023-12-05 DIAGNOSIS — Z96642 Presence of left artificial hip joint: Secondary | ICD-10-CM | POA: Diagnosis not present

## 2023-12-05 DIAGNOSIS — Z7982 Long term (current) use of aspirin: Secondary | ICD-10-CM | POA: Insufficient documentation

## 2023-12-05 DIAGNOSIS — E66813 Obesity, class 3: Secondary | ICD-10-CM | POA: Diagnosis not present

## 2023-12-05 DIAGNOSIS — E039 Hypothyroidism, unspecified: Secondary | ICD-10-CM | POA: Diagnosis not present

## 2023-12-05 DIAGNOSIS — Z6841 Body Mass Index (BMI) 40.0 and over, adult: Secondary | ICD-10-CM

## 2023-12-05 DIAGNOSIS — M1612 Unilateral primary osteoarthritis, left hip: Principal | ICD-10-CM | POA: Diagnosis present

## 2023-12-05 HISTORY — PX: TOTAL HIP ARTHROPLASTY: SHX124

## 2023-12-05 LAB — ABO/RH: ABO/RH(D): A POS

## 2023-12-05 SURGERY — ARTHROPLASTY, HIP, TOTAL, ANTERIOR APPROACH
Anesthesia: Spinal | Site: Hip | Laterality: Left

## 2023-12-05 MED ORDER — TIZANIDINE HCL 4 MG PO TABS
4.0000 mg | ORAL_TABLET | Freq: Four times a day (QID) | ORAL | Status: DC | PRN
  Administered 2023-12-05 – 2023-12-07 (×6): 4 mg via ORAL
  Filled 2023-12-05 (×6): qty 1

## 2023-12-05 MED ORDER — FENTANYL CITRATE (PF) 100 MCG/2ML IJ SOLN
INTRAMUSCULAR | Status: AC
Start: 2023-12-05 — End: 2023-12-05
  Filled 2023-12-05: qty 2

## 2023-12-05 MED ORDER — GLYCOPYRROLATE PF 0.2 MG/ML IJ SOSY
PREFILLED_SYRINGE | INTRAMUSCULAR | Status: DC | PRN
  Administered 2023-12-05: .2 mg via INTRAVENOUS

## 2023-12-05 MED ORDER — ALUM & MAG HYDROXIDE-SIMETH 200-200-20 MG/5ML PO SUSP: 30.0000 mL | ORAL | Status: DC | PRN

## 2023-12-05 MED ORDER — ACETAMINOPHEN 10 MG/ML IV SOLN
1000.0000 mg | Freq: Once | INTRAVENOUS | Status: DC | PRN
  Administered 2023-12-05: 1000 mg via INTRAVENOUS

## 2023-12-05 MED ORDER — LEVOTHYROXINE SODIUM 100 MCG PO TABS: 300.0000 ug | ORAL_TABLET | ORAL | Status: DC

## 2023-12-05 MED ORDER — SUGAMMADEX SODIUM 200 MG/2ML IV SOLN
INTRAVENOUS | Status: DC | PRN
  Administered 2023-12-05: 200 mg via INTRAVENOUS

## 2023-12-05 MED ORDER — MIDAZOLAM HCL 2 MG/2ML IJ SOLN
INTRAMUSCULAR | Status: DC | PRN
  Administered 2023-12-05: 2 mg via INTRAVENOUS

## 2023-12-05 MED ORDER — ONDANSETRON HCL 4 MG/2ML IJ SOLN
INTRAMUSCULAR | Status: DC | PRN
  Administered 2023-12-05: 4 mg via INTRAVENOUS

## 2023-12-05 MED ORDER — EPHEDRINE SULFATE-NACL 50-0.9 MG/10ML-% IV SOSY
PREFILLED_SYRINGE | INTRAVENOUS | Status: DC | PRN
  Administered 2023-12-05: 5 mg via INTRAVENOUS

## 2023-12-05 MED ORDER — MIDAZOLAM HCL 2 MG/2ML IJ SOLN
INTRAMUSCULAR | Status: AC
  Filled 2023-12-05: qty 2

## 2023-12-05 MED ORDER — ONDANSETRON HCL 4 MG/2ML IJ SOLN: 4.0000 mg | Freq: Four times a day (QID) | INTRAMUSCULAR | Status: DC | PRN

## 2023-12-05 MED ORDER — CHLORHEXIDINE GLUCONATE 0.12 % MT SOLN
15.0000 mL | Freq: Once | OROMUCOSAL | Status: AC
  Administered 2023-12-05: 15 mL via OROMUCOSAL
  Filled 2023-12-05: qty 15

## 2023-12-05 MED ORDER — PHENOL 1.4 % MT LIQD: 1.0000 | OROMUCOSAL | Status: DC | PRN

## 2023-12-05 MED ORDER — OXYCODONE HCL 5 MG PO TABS
ORAL_TABLET | ORAL | Status: AC
  Filled 2023-12-05: qty 1

## 2023-12-05 MED ORDER — SODIUM CHLORIDE 0.9 % IR SOLN
Status: DC | PRN
  Administered 2023-12-05: 3000 mL

## 2023-12-05 MED ORDER — PANTOPRAZOLE SODIUM 40 MG PO TBEC
40.0000 mg | DELAYED_RELEASE_TABLET | Freq: Every day | ORAL | Status: DC
  Administered 2023-12-05 – 2023-12-07 (×3): 40 mg via ORAL
  Filled 2023-12-05 (×3): qty 1

## 2023-12-05 MED ORDER — TRANEXAMIC ACID-NACL 1000-0.7 MG/100ML-% IV SOLN
1000.0000 mg | INTRAVENOUS | Status: AC
  Administered 2023-12-05: 1000 mg via INTRAVENOUS
  Filled 2023-12-05: qty 100

## 2023-12-05 MED ORDER — FENTANYL CITRATE (PF) 250 MCG/5ML IJ SOLN
INTRAMUSCULAR | Status: AC
  Filled 2023-12-05: qty 5

## 2023-12-05 MED ORDER — DEXAMETHASONE SODIUM PHOSPHATE 10 MG/ML IJ SOLN
INTRAMUSCULAR | Status: DC | PRN
  Administered 2023-12-05: 8 mg via INTRAVENOUS

## 2023-12-05 MED ORDER — LEVOTHYROXINE SODIUM 100 MCG PO TABS: 200.0000 ug | ORAL_TABLET | ORAL | Status: DC

## 2023-12-05 MED ORDER — CEFAZOLIN SODIUM-DEXTROSE 2-4 GM/100ML-% IV SOLN
2.0000 g | INTRAVENOUS | Status: AC
  Administered 2023-12-05: 2 g via INTRAVENOUS
  Filled 2023-12-05: qty 100

## 2023-12-05 MED ORDER — HYDROMORPHONE HCL 1 MG/ML IJ SOLN
0.5000 mg | INTRAMUSCULAR | Status: DC | PRN
  Administered 2023-12-05 – 2023-12-06 (×4): 1 mg via INTRAVENOUS
  Filled 2023-12-05 (×5): qty 1

## 2023-12-05 MED ORDER — OXYCODONE HCL 5 MG PO TABS
5.0000 mg | ORAL_TABLET | ORAL | Status: DC | PRN
  Administered 2023-12-05: 10 mg via ORAL
  Administered 2023-12-05: 5 mg via ORAL
  Administered 2023-12-06: 10 mg via ORAL
  Filled 2023-12-05 (×2): qty 2

## 2023-12-05 MED ORDER — FERROUS SULFATE 325 (65 FE) MG PO TABS
325.0000 mg | ORAL_TABLET | Freq: Every day | ORAL | Status: DC
  Administered 2023-12-06 – 2023-12-07 (×2): 325 mg via ORAL
  Filled 2023-12-05 (×3): qty 1

## 2023-12-05 MED ORDER — DEXAMETHASONE SODIUM PHOSPHATE 10 MG/ML IJ SOLN
INTRAMUSCULAR | Status: AC
Start: 2023-12-05 — End: 2023-12-05
  Filled 2023-12-05: qty 1

## 2023-12-05 MED ORDER — OXYCODONE HCL 5 MG PO TABS
10.0000 mg | ORAL_TABLET | ORAL | Status: DC | PRN
  Administered 2023-12-06 – 2023-12-07 (×5): 15 mg via ORAL
  Filled 2023-12-05 (×4): qty 3
  Filled 2023-12-05: qty 2
  Filled 2023-12-05: qty 3

## 2023-12-05 MED ORDER — GABAPENTIN 100 MG PO CAPS
100.0000 mg | ORAL_CAPSULE | Freq: Three times a day (TID) | ORAL | Status: DC
  Administered 2023-12-05 – 2023-12-07 (×6): 100 mg via ORAL
  Filled 2023-12-05 (×6): qty 1

## 2023-12-05 MED ORDER — SODIUM CHLORIDE 0.9 % IV SOLN: INTRAVENOUS | Status: DC

## 2023-12-05 MED ORDER — FENTANYL CITRATE (PF) 250 MCG/5ML IJ SOLN
INTRAMUSCULAR | Status: DC | PRN
  Administered 2023-12-05 (×3): 50 ug via INTRAVENOUS
  Administered 2023-12-05: 100 ug via INTRAVENOUS
  Administered 2023-12-05: 50 ug via INTRAVENOUS
  Administered 2023-12-05: 75 ug via INTRAVENOUS

## 2023-12-05 MED ORDER — DOCUSATE SODIUM 100 MG PO CAPS
100.0000 mg | ORAL_CAPSULE | Freq: Two times a day (BID) | ORAL | Status: DC
  Administered 2023-12-05 – 2023-12-07 (×5): 100 mg via ORAL
  Filled 2023-12-05 (×5): qty 1

## 2023-12-05 MED ORDER — METOCLOPRAMIDE HCL 5 MG PO TABS: 5.0000 mg | ORAL_TABLET | Freq: Three times a day (TID) | ORAL | Status: DC | PRN

## 2023-12-05 MED ORDER — HYDROMORPHONE HCL 1 MG/ML IJ SOLN
0.2500 mg | INTRAMUSCULAR | Status: DC | PRN
  Administered 2023-12-05: 0.25 mg via INTRAVENOUS
  Administered 2023-12-05 (×2): 0.5 mg via INTRAVENOUS
  Administered 2023-12-05: 0.25 mg via INTRAVENOUS
  Administered 2023-12-05: 0.5 mg via INTRAVENOUS

## 2023-12-05 MED ORDER — PROPOFOL 10 MG/ML IV BOLUS
INTRAVENOUS | Status: DC | PRN
  Administered 2023-12-05: 150 mg via INTRAVENOUS
  Administered 2023-12-05 (×3): 10 mg via INTRAVENOUS
  Administered 2023-12-05: 20 mg via INTRAVENOUS

## 2023-12-05 MED ORDER — AMISULPRIDE (ANTIEMETIC) 5 MG/2ML IV SOLN: 10.0000 mg | Freq: Once | INTRAVENOUS | Status: DC | PRN

## 2023-12-05 MED ORDER — LEVOTHYROXINE SODIUM 100 MCG PO TABS
200.0000 ug | ORAL_TABLET | ORAL | Status: DC
  Administered 2023-12-06 – 2023-12-07 (×2): 200 ug via ORAL
  Filled 2023-12-05 (×2): qty 2

## 2023-12-05 MED ORDER — EPHEDRINE 5 MG/ML INJ
INTRAVENOUS | Status: AC
Start: 2023-12-05 — End: 2023-12-05
  Filled 2023-12-05: qty 5

## 2023-12-05 MED ORDER — ACETAMINOPHEN 10 MG/ML IV SOLN
INTRAVENOUS | Status: AC
  Filled 2023-12-05: qty 100

## 2023-12-05 MED ORDER — ONDANSETRON HCL 4 MG/2ML IJ SOLN
INTRAMUSCULAR | Status: AC
Start: 2023-12-05 — End: 2023-12-05
  Filled 2023-12-05: qty 2

## 2023-12-05 MED ORDER — LIDOCAINE 2% (20 MG/ML) 5 ML SYRINGE
INTRAMUSCULAR | Status: AC
  Filled 2023-12-05: qty 5

## 2023-12-05 MED ORDER — GLYCOPYRROLATE PF 0.2 MG/ML IJ SOSY
PREFILLED_SYRINGE | INTRAMUSCULAR | Status: AC
  Filled 2023-12-05: qty 1

## 2023-12-05 MED ORDER — HYDROMORPHONE HCL 1 MG/ML IJ SOLN
INTRAMUSCULAR | Status: AC
  Filled 2023-12-05: qty 1

## 2023-12-05 MED ORDER — METOCLOPRAMIDE HCL 5 MG/ML IJ SOLN: 5.0000 mg | Freq: Three times a day (TID) | INTRAMUSCULAR | Status: DC | PRN

## 2023-12-05 MED ORDER — ASPIRIN 81 MG PO CHEW
81.0000 mg | CHEWABLE_TABLET | Freq: Two times a day (BID) | ORAL | Status: DC
  Administered 2023-12-05 – 2023-12-07 (×4): 81 mg via ORAL
  Filled 2023-12-05 (×4): qty 1

## 2023-12-05 MED ORDER — OXYCODONE HCL 5 MG/5ML PO SOLN: 5.0000 mg | Freq: Once | ORAL | Status: AC | PRN

## 2023-12-05 MED ORDER — FENTANYL CITRATE (PF) 100 MCG/2ML IJ SOLN
25.0000 ug | INTRAMUSCULAR | Status: DC | PRN
  Administered 2023-12-05 (×3): 50 ug via INTRAVENOUS

## 2023-12-05 MED ORDER — DIPHENHYDRAMINE HCL 12.5 MG/5ML PO ELIX: 12.5000 mg | ORAL_SOLUTION | ORAL | Status: DC | PRN

## 2023-12-05 MED ORDER — 0.9 % SODIUM CHLORIDE (POUR BTL) OPTIME
TOPICAL | Status: DC | PRN
Start: 2023-12-05 — End: 2023-12-05
  Administered 2023-12-05: 1000 mL

## 2023-12-05 MED ORDER — POVIDONE-IODINE 10 % EX SWAB
2.0000 | Freq: Once | CUTANEOUS | Status: AC
  Administered 2023-12-05: 2 via TOPICAL

## 2023-12-05 MED ORDER — LACTATED RINGERS IV SOLN: INTRAVENOUS | Status: DC

## 2023-12-05 MED ORDER — CEFAZOLIN SODIUM-DEXTROSE 2-4 GM/100ML-% IV SOLN
2.0000 g | Freq: Four times a day (QID) | INTRAVENOUS | Status: AC
  Administered 2023-12-05 (×2): 2 g via INTRAVENOUS
  Filled 2023-12-05 (×2): qty 100

## 2023-12-05 MED ORDER — BIOTIN 1000 MCG PO TABS: 1000.0000 ug | ORAL_TABLET | Freq: Every day | ORAL | Status: DC

## 2023-12-05 MED ORDER — ONDANSETRON HCL 4 MG PO TABS: 4.0000 mg | ORAL_TABLET | Freq: Four times a day (QID) | ORAL | Status: DC | PRN

## 2023-12-05 MED ORDER — OXYCODONE HCL 5 MG PO TABS
5.0000 mg | ORAL_TABLET | Freq: Once | ORAL | Status: AC | PRN
  Administered 2023-12-05: 5 mg via ORAL

## 2023-12-05 MED ORDER — LIDOCAINE 2% (20 MG/ML) 5 ML SYRINGE
INTRAMUSCULAR | Status: DC | PRN
  Administered 2023-12-05: 60 mg via INTRAVENOUS

## 2023-12-05 MED ORDER — ATORVASTATIN CALCIUM 10 MG PO TABS
20.0000 mg | ORAL_TABLET | Freq: Every day | ORAL | Status: DC
  Administered 2023-12-06 – 2023-12-07 (×2): 20 mg via ORAL
  Filled 2023-12-05 (×3): qty 2

## 2023-12-05 MED ORDER — MENTHOL 3 MG MT LOZG: 1.0000 | LOZENGE | OROMUCOSAL | Status: DC | PRN

## 2023-12-05 MED ORDER — ROCURONIUM BROMIDE 10 MG/ML (PF) SYRINGE
PREFILLED_SYRINGE | INTRAVENOUS | Status: AC
  Filled 2023-12-05: qty 10

## 2023-12-05 MED ORDER — ORAL CARE MOUTH RINSE: 15.0000 mL | Freq: Once | OROMUCOSAL | Status: AC

## 2023-12-05 MED ORDER — ROCURONIUM BROMIDE 100 MG/10ML IV SOLN
INTRAVENOUS | Status: DC | PRN
Start: 2023-12-05 — End: 2023-12-05
  Administered 2023-12-05 (×2): 15 mg via INTRAVENOUS
  Administered 2023-12-05: 60 mg via INTRAVENOUS

## 2023-12-05 SURGICAL SUPPLY — 44 items
BAG COUNTER SPONGE SURGICOUNT (BAG) ×1 IMPLANT
BENZOIN TINCTURE PRP APPL 2/3 (GAUZE/BANDAGES/DRESSINGS) ×1 IMPLANT
BLADE CLIPPER SURG (BLADE) IMPLANT
BLADE SAW SGTL 18X1.27X75 (BLADE) ×1 IMPLANT
COVER SURGICAL LIGHT HANDLE (MISCELLANEOUS) ×1 IMPLANT
CUP SECTOR GRIPTON 50MM (Cup) IMPLANT
DRAPE C-ARM 42X72 X-RAY (DRAPES) ×1 IMPLANT
DRAPE STERI IOBAN 125X83 (DRAPES) ×1 IMPLANT
DRAPE U-SHAPE 47X51 STRL (DRAPES) ×3 IMPLANT
DRSG AQUACEL AG ADV 3.5X10 (GAUZE/BANDAGES/DRESSINGS) ×1 IMPLANT
DURAPREP 26ML APPLICATOR (WOUND CARE) ×1 IMPLANT
ELECT BLADE 6.5 EXT (BLADE) IMPLANT
ELECTRODE BLDE 4.0 EZ CLN MEGD (MISCELLANEOUS) ×1 IMPLANT
ELECTRODE REM PT RTRN 9FT ADLT (ELECTROSURGICAL) ×1 IMPLANT
FACESHIELD WRAPAROUND OR TEAM (MASK) ×2 IMPLANT
GLOVE BIOGEL PI IND STRL 8 (GLOVE) ×2 IMPLANT
GLOVE ECLIPSE 8.0 STRL XLNG CF (GLOVE) ×1 IMPLANT
GLOVE ORTHO TXT STRL SZ7.5 (GLOVE) ×2 IMPLANT
GOWN STRL REUS W/ TWL LRG LVL3 (GOWN DISPOSABLE) ×2 IMPLANT
GOWN STRL REUS W/ TWL XL LVL3 (GOWN DISPOSABLE) ×2 IMPLANT
HEAD FEMORAL 32 CERAMIC (Hips) IMPLANT
KIT BASIN OR (CUSTOM PROCEDURE TRAY) ×1 IMPLANT
KIT TURNOVER KIT B (KITS) ×1 IMPLANT
LINER ACET PNNCL PLUS4 NEUTRAL (Hips) IMPLANT
MANIFOLD NEPTUNE II (INSTRUMENTS) ×1 IMPLANT
NS IRRIG 1000ML POUR BTL (IV SOLUTION) ×1 IMPLANT
PACK TOTAL JOINT (CUSTOM PROCEDURE TRAY) ×1 IMPLANT
PAD ARMBOARD POSITIONER FOAM (MISCELLANEOUS) ×1 IMPLANT
SET HNDPC FAN SPRY TIP SCT (DISPOSABLE) ×1 IMPLANT
SOL .9 NS 3000ML IRR UROMATIC (IV SOLUTION) IMPLANT
STAPLER SKIN PROX 35W (STAPLE) IMPLANT
STEM FEMORAL SZ5 HIGH ACTIS (Stem) IMPLANT
STRIP CLOSURE SKIN 1/2X4 (GAUZE/BANDAGES/DRESSINGS) ×2 IMPLANT
SUT ETHIBOND NAB CT1 #1 30IN (SUTURE) ×1 IMPLANT
SUT ETHILON 2 0 PSLX (SUTURE) IMPLANT
SUT MNCRL AB 4-0 PS2 18 (SUTURE) IMPLANT
SUT VIC AB 0 CT1 27XBRD ANBCTR (SUTURE) ×1 IMPLANT
SUT VIC AB 1 CT1 27XBRD ANBCTR (SUTURE) ×1 IMPLANT
SUT VIC AB 2-0 CT1 TAPERPNT 27 (SUTURE) ×1 IMPLANT
TOWEL GREEN STERILE (TOWEL DISPOSABLE) ×1 IMPLANT
TOWEL GREEN STERILE FF (TOWEL DISPOSABLE) ×1 IMPLANT
TRAY CATH INTERMITTENT SS 16FR (CATHETERS) IMPLANT
TRAY FOLEY W/BAG SLVR 16FR ST (SET/KITS/TRAYS/PACK) IMPLANT
WATER STERILE IRR 1000ML POUR (IV SOLUTION) ×2 IMPLANT

## 2023-12-05 NOTE — Anesthesia Procedure Notes (Signed)
 Procedure Name: Intubation Date/Time: 12/05/2023 7:54 AM  Performed by: Roslynn Waddell LABOR, CRNAPre-anesthesia Checklist: Patient identified, Emergency Drugs available, Suction available and Patient being monitored Patient Re-evaluated:Patient Re-evaluated prior to induction Oxygen Delivery Method: Circle System Utilized Preoxygenation: Pre-oxygenation with 100% oxygen Induction Type: IV induction Ventilation: Mask ventilation without difficulty Laryngoscope Size: Mac and 3 Grade View: Grade I Tube type: Oral Tube size: 7.0 mm Number of attempts: 1 Airway Equipment and Method: Stylet and Oral airway Placement Confirmation: ETT inserted through vocal cords under direct vision, positive ETCO2 and breath sounds checked- equal and bilateral Secured at: 21 cm Tube secured with: Tape Dental Injury: Teeth and Oropharynx as per pre-operative assessment  Comments: Atraumatic induction/intubation. Dentition and oral mucosa as per preop.

## 2023-12-05 NOTE — Progress Notes (Signed)
 Unable to apply TED hose. We do not have the correct size in stock.

## 2023-12-05 NOTE — Interval H&P Note (Signed)
 History and Physical Interval Note: The patient understands that she is here today for a left total hip replacement to treat her significant left hip pain and arthritis.  There has been no acute or interval change in her medical status.  The risks and benefits of surgery have been discussed in detail and informed consent has been obtained.  The left operative hip has been marked.  12/05/2023 7:13 AM  [name]  has presented today for surgery, with the diagnosis of osteoarthritis left hip.  The various methods of treatment have been discussed with the patient and family. After consideration of risks, benefits and other options for treatment, the patient has consented to  Procedure(s): ARTHROPLASTY, HIP, TOTAL, ANTERIOR APPROACH (Left) as a surgical intervention.  The patient's history has been reviewed, patient examined, no change in status, stable for surgery.  I have reviewed the patient's chart and labs.  Questions were answered to the patient's satisfaction.     [name]

## 2023-12-05 NOTE — Anesthesia Postprocedure Evaluation (Signed)
 Anesthesia Post Note  Patient: [name]  Procedure(s) Performed: ARTHROPLASTY, HIP, TOTAL, ANTERIOR APPROACH (Left: Hip)     Patient location during evaluation: PACU Anesthesia Type: General Level of consciousness: awake Pain management: pain level controlled Vital Signs Assessment: post-procedure vital signs reviewed and stable Respiratory status: spontaneous breathing, nonlabored ventilation and respiratory function stable Cardiovascular status: blood pressure returned to baseline and stable Postop Assessment: no apparent nausea or vomiting Anesthetic complications: no   No notable events documented.  Last Vitals:  Vitals:   12/05/23 1132 12/05/23 1419  BP: (!) 142/75 121/71  Pulse: 79 81  Resp: 16   Temp: 36.5 C 36.6 C  SpO2: 97% 100%    Last Pain:  Vitals:   12/05/23 1552  TempSrc:   PainSc: 6                  Nichola Warren P Kamri Gotsch

## 2023-12-05 NOTE — Plan of Care (Signed)

## 2023-12-05 NOTE — Transfer of Care (Signed)
 Immediate Anesthesia Transfer of Care Note  Patient: [name]  Procedure(s) Performed: ARTHROPLASTY, HIP, TOTAL, ANTERIOR APPROACH (Left: Hip)  Patient Location: PACU  Anesthesia Type:General  Level of Consciousness: awake and alert   Airway & Oxygen Therapy: Patient Spontanous Breathing and Patient connected to face mask oxygen  Post-op Assessment: Report given to RN and Post -op Vital signs reviewed and stable  Post vital signs: Reviewed and stable  Last Vitals:  Vitals Value Taken Time  BP 128/70 12/05/23 09:42  Temp    Pulse 68 12/05/23 09:44  Resp 12 12/05/23 09:44  SpO2 100 % 12/05/23 09:44  Vitals shown include unfiled device data.  Last Pain:  Vitals:   12/05/23 0554  TempSrc:   PainSc: 0-No pain         Complications: No notable events documented.

## 2023-12-05 NOTE — Plan of Care (Signed)

## 2023-12-05 NOTE — Anesthesia Preprocedure Evaluation (Addendum)
 Anesthesia Evaluation  Patient identified by MRN, date of birth, ID band Patient awake    Reviewed: Allergy & Precautions, NPO status , Patient's Chart, lab work & pertinent test results  Airway Mallampati: II  TM Distance: >3 FB Neck ROM: Full    Dental no notable dental hx.    Pulmonary neg pulmonary ROS   Pulmonary exam normal        Cardiovascular negative cardio ROS Normal cardiovascular exam     Neuro/Psych negative neurological ROS  negative psych ROS   GI/Hepatic negative GI ROS, Neg liver ROS,,,  Endo/Other  Hypothyroidism  Class 3 obesityPatient on GLP-1 Agonist  Renal/GU negative Renal ROS     Musculoskeletal  (+) Arthritis ,    Abdominal  (+) + obese  Peds  Hematology negative hematology ROS (+) PLT: 258   Anesthesia Other Findings osteoarthritis left hip  Reproductive/Obstetrics                              Anesthesia Physical Anesthesia Plan  ASA: 3  Anesthesia Plan: Spinal   Post-op Pain Management:    Induction: Intravenous  PONV Risk Score and Plan: 3 and Ondansetron , Midazolam , Propofol  infusion, Dexamethasone  and Treatment may vary due to age or medical condition  Airway Management Planned: Simple Face Mask  Additional Equipment:   Intra-op Plan:   Post-operative Plan:   Informed Consent: I have reviewed the patients History and Physical, chart, labs and discussed the procedure including the risks, benefits and alternatives for the proposed anesthesia with the patient or authorized representative who has indicated his/her understanding and acceptance.     Dental advisory given  Plan Discussed with: CRNA  Anesthesia Plan Comments:         Anesthesia Quick Evaluation

## 2023-12-05 NOTE — Evaluation (Signed)
 Physical Therapy Evaluation Patient Details Name: [name] MRN: [id number] DOB: 07/08/1960 Today's Date: 12/05/2023  History of Present Illness  63 y.o. female presents to [facility] hospital on 12/05/2023 for elective L THA. PMH includes hypothyroidism, lichen sclerosus, HSV, OA.  Clinical Impression  Pt presents to PT with deficits in functional mobility, gait, balance, strength, ROM. Pt is able to ambulate for household distances with support of the RW. PT provides education on the THA exercise packet and encourages frequent mobilization with staff assistance. PT will follow up tomorrow for a progression of gait and to initiate stair training.        If plan is discharge home, recommend the following: A little help with walking and/or transfers;A little help with bathing/dressing/bathroom;Assistance with cooking/housework;Assist for transportation;Help with stairs or ramp for entrance   Can travel by private vehicle        Equipment Recommendations None recommended by PT  Recommendations for Other Services       Functional Status Assessment Patient has had a recent decline in their functional status and demonstrates the ability to make significant improvements in function in a reasonable and predictable amount of time.     Precautions / Restrictions Precautions Precautions: Fall Recall of Precautions/Restrictions: Intact Precaution/Restrictions Comments: direct anterior THA, no precautions Restrictions Weight Bearing Restrictions Per Provider Order: Yes LLE Weight Bearing Per Provider Order: Weight bearing as tolerated      Mobility  Bed Mobility Overal bed mobility: Needs Assistance Bed Mobility: Supine to Sit, Sit to Supine     Supine to sit: Min assist, HOB elevated Sit to supine: Min assist, HOB elevated        Transfers Overall transfer level: Needs assistance Equipment used: Rolling walker (2 wheels) Transfers: Sit to/from Stand Sit to Stand: Contact guard  assist                Ambulation/Gait Ambulation/Gait assistance: Contact guard assist Gait Distance (Feet): 25 Feet (additional trial of 15') Assistive device: Rolling walker (2 wheels) Gait Pattern/deviations: Step-to pattern Gait velocity: reduced Gait velocity interpretation: <1.8 ft/sec, indicate of risk for recurrent falls   General Gait Details: slowed step-to gait, widened BOS, reduced stance time on LLE  Stairs            Wheelchair Mobility     Tilt Bed    Modified Rankin (Stroke Patients Only)       Balance Overall balance assessment: Needs assistance Sitting-balance support: No upper extremity supported, Feet supported Sitting balance-Leahy Scale: Good     Standing balance support: Single extremity supported, Reliant on assistive device for balance Standing balance-Leahy Scale: Poor                               Pertinent Vitals/Pain Pain Assessment Pain Assessment: 0-10 Pain Score: 2  Pain Location: L hip Pain Descriptors / Indicators: Sore Pain Intervention(s): Monitored during session    Home Living Family/patient expects to be discharged to:: Private residence Living Arrangements: Spouse/significant other Available Help at Discharge: Family;Available 24 hours/day Type of Home: House Home Access: Stairs to enter Entrance Stairs-Rails: Left Entrance Stairs-Number of Steps: 4   Home Layout: Multi-level;Able to live on main level with bedroom/bathroom Home Equipment: Rolling Walker (2 wheels);Rollator (4 wheels);Standard Walker;Toilet riser;Crutches      Prior Function Prior Level of Function : Independent/Modified Independent;Working/employed             Mobility Comments: ambulatory with rollator  most recently. pt reports being dependent on a walker of some sort for the last 3 years due to hip pain       Extremity/Trunk Assessment   Upper Extremity Assessment Upper Extremity Assessment: Overall WFL for tasks  assessed    Lower Extremity Assessment Lower Extremity Assessment: LLE deficits/detail LLE Deficits / Details: generalized post-op weakness as anticipated on POD 0    Cervical / Trunk Assessment Cervical / Trunk Assessment: Other exceptions Cervical / Trunk Exceptions: body habitus  Communication   Communication Communication: No apparent difficulties    Cognition Arousal: Alert Behavior During Therapy: WFL for tasks assessed/performed   PT - Cognitive impairments: No apparent impairments                         Following commands: Intact       Cueing Cueing Techniques: Verbal cues     General Comments General comments (skin integrity, edema, etc.): VSS on RA    Exercises Other Exercises Other Exercises: PT provides education on surgical hip exercise packet   Assessment/Plan    PT Assessment Patient needs continued PT services  PT Problem List Decreased strength;Decreased activity tolerance;Decreased range of motion;Decreased mobility;Decreased balance;Decreased knowledge of use of DME;Pain       PT Treatment Interventions Gait training;DME instruction;Stair training;Therapeutic activities;Functional mobility training;Therapeutic exercise;Neuromuscular re-education;Balance training;Patient/family education    PT Goals (Current goals can be found in the Care Plan section)  Acute Rehab PT Goals Patient Stated Goal: to return to independence PT Goal Formulation: With patient/family Time For Goal Achievement: 12/09/23 Potential to Achieve Goals: Good    Frequency 7X/week     Co-evaluation               AM-PAC PT 6 Clicks Mobility  Outcome Measure Help needed turning from your back to your side while in a flat bed without using bedrails?: A Little Help needed moving from lying on your back to sitting on the side of a flat bed without using bedrails?: A Little Help needed moving to and from a bed to a chair (including a wheelchair)?: A  Little Help needed standing up from a chair using your arms (e.g., wheelchair or bedside chair)?: A Little Help needed to walk in hospital room?: A Little Help needed climbing 3-5 steps with a railing? : A Lot 6 Click Score: 17    End of Session Equipment Utilized During Treatment: Gait belt Activity Tolerance: Patient tolerated treatment well Patient left: in bed;with call bell/phone within reach;with bed alarm set;with family/visitor present Nurse Communication: Mobility status PT Visit Diagnosis: Other abnormalities of gait and mobility (R26.89);Muscle weakness (generalized) (M62.81)    Time: 8672-8645 PT Time Calculation (min) (ACUTE ONLY): 27 min   Charges:   PT Evaluation $PT Eval Low Complexity: 1 Low   PT General Charges $$ ACUTE PT VISIT: 1 Visit         Bernardino JINNY Ruth, PT, DPT Acute Rehabilitation Office [phone]   Bernardino JINNY Ruth 12/05/2023, 2:00 PM

## 2023-12-05 NOTE — Op Note (Signed)
 Operative Note  Date of operation: 12/05/2023 Preoperative diagnosis: Left hip primary osteoarthritis Postoperative diagnosis: Same  Procedure: Left direct anterior total hip arthroplasty  Implants: Implant Name Type Inv. Item Serial No. Manufacturer Lot No. LRB No. Used Action  CUP SECTOR GRIPTON - [id number] Cup CUP SECTOR GRIPTON  [employer]PU[employer] [id number] Left 1 Implanted  LINER ACET PNNCL PLUS4 NEUTRAL - [id number] Hips LINER ACET PNNCL PLUS4 NEUTRAL  DEPUY ORTHOPA[employer] [id number] Left 1 Implanted  STEM FEMORAL SZ5 HIGH ACTIS - [id number] Stem STEM FEMORAL SZ5 HIGH ACTIS  DEPUY ORTH[employer] [id number] Left 1 Implanted  HEAD FEMORAL 32 CERAMIC - [id number] Hips HEAD FEMORAL 32 CERAMIC  DEPUY ORTH[employer] [id number] Left 1 Implanted   Surgeon: Lonni GRADE. Vernetta, MD Assistant: Tory Gaskins, PA-C  Anesthesia: #1 attempted spinal, #2 General EBL: 200 to 250 cc Antibiotics: IV Ancef  Complications: None  Indications: The patient is a 63 year old female with severe debilitating arthritis involving her left hip this been ongoing for a long period of time.  At this point her left hip pain is daily and it is detrimentally affecting her mobility, her quality of life and actives daily living to the point she was to proceed with a total hip arthroplasty.  Her x-rays do show bone-on-bone wear.  We have had to delay the surgery due to her significant morbid obesity and she has been on a weight loss journey has been able to lose weight.  She understands this case will still be significantly challenging with a BMI of 40.6.  We did discuss the heightened risks of acute blood loss anemia, nerve vessel injury, fracture, infection, DVT, dislocation, implant failure, leg length differences and especially wound healing issues all heightened given her morbid obesity.  She understands that our goals are hopefully decreased pain, improved mobility and improve quality of life.  Procedure description: After  informed consent was obtained and the appropriate left hip was marked, the patient was brought to the operating room and set up on the stretcher where spinal anesthesia was attempted.  It was not successful so she is laid supine on the stretcher and general anesthesia was obtained.  Traction boots were placed on both her feet and next she was placed supine on the Hana fracture table with a perineal post in place in both legs and inline skeletal traction devices were no traction applied.  Her left operative hip and pelvis were assessed radiographically.  The left hip was prepped and draped with DuraPrep and sterile drapes.  A timeout was called and she was identified as correct patient to correct the left hip.  An incision was then made just inferior and posterior to the ASIS and carried slightly obliquely down the leg.  Dissection was carried down to the tensor fascia lata muscle and the tensor fascia was then divided longitudinally to proceed with a direct interposed the hip.  Circumflex vessels were identified and cauterized.  The hip capsule was identified and opened up in L-type format finding a moderate joint effusion.  Cobra retractors were placed around the medial and lateral femoral neck and a femoral neck cut was made with an oscillating saw just proximal to the lesser trochanter and the scope was completed with an osteotome.  A corkscrew was placed in the femoral head and the femoral head was removed in its entirety finding a wide area devoid of cartilage.  A bent Hohmann was then placed over the medial acetabular rim and remnants of the acetabular labrum and other debris removed.  Reaming was then initiated using the reaming system from a size 43 reamer and stepwise increments going up to a size 49 reamer with all reamers placed under direct visualization and the last reamer also placed under direct fluoroscopy in order to obtain the depth of reaming, the inclination and the anteversion.  The real DePuy  sector GRIPTION acetabular component size 50 was then placed without difficulty followed by a 32+4 polythene liner.  Attention was then turned to the femur.  With the left leg externally rotated to 120 degrees, extended and adducted, a Mueller retractor was placed medially and a Hohmann retractor behind the greater trochanter.  The lateral joint capsule was released and a box cutting osteotome was used into the femoral canal.  Broaching was then initiated using the Actis broaching system from a size 0 going to a size 5.  With a size 5 in place we trialed a high offset femoral neck and a 32+1 trial head ball.  The left leg was brought over and up and with traction and internal rotation reduced in the pelvis.  We assessed this radiographically and clinically and it was felt to be stable.  We were pleased with leg length and offset as well.  We then dislocated the hip and removed the trial components.  We placed the real Actis femoral component with high offset size 5 and the real 32+1 ceramic head ball.  Again this was reduced in the pelvis and we are pleased with leg length, offset, range of motion and stability.  This was assessed again clinically and radiographically.  The soft tissue was then irrigated normal saline solution using pulsatile lavage.  Remnants of the joint capsule were closed with interrupted #1 Ethibond suture followed by normal Vicryl to close the tensor fascia.  0 Vicryl was used to close the deep tissue and 2-0 Vicryl was used to close subcutaneous tissue.  Interrupted 2-0 nylon was used to close the skin.  An Aquacel dressing was applied.  The patient was taken off the Hana table, awakened, extubated and taken to the recovery room in stable condition.  Tory Gaskins, PA-C did assist during the entire case and beginning the end and his assistance was crucial and medically necessary for soft tissue management and retraction, helping guide implant placement and a layered closure of the wound.

## 2023-12-06 ENCOUNTER — Encounter (HOSPITAL_COMMUNITY): Payer: Self-pay | Admitting: Orthopaedic Surgery

## 2023-12-06 DIAGNOSIS — M1612 Unilateral primary osteoarthritis, left hip: Secondary | ICD-10-CM | POA: Diagnosis not present

## 2023-12-06 LAB — CBC
HCT: 30.5 % — ABNORMAL LOW (ref 36.0–46.0)
Hemoglobin: 10.1 g/dL — ABNORMAL LOW (ref 12.0–15.0)
MCH: 29.4 pg (ref 26.0–34.0)
MCHC: 33.1 g/dL (ref 30.0–36.0)
MCV: 88.7 fL (ref 80.0–100.0)
Platelets: 200 K/uL (ref 150–400)
RBC: 3.44 MIL/uL — ABNORMAL LOW (ref 3.87–5.11)
RDW: 14.3 % (ref 11.5–15.5)
WBC: 6.9 K/uL (ref 4.0–10.5)
nRBC: 0 % (ref 0.0–0.2)

## 2023-12-06 LAB — BASIC METABOLIC PANEL WITH GFR
Anion gap: 8 (ref 5–15)
BUN: 16 mg/dL (ref 8–23)
CO2: 24 mmol/L (ref 22–32)
Calcium: 8.6 mg/dL — ABNORMAL LOW (ref 8.9–10.3)
Chloride: 104 mmol/L (ref 98–111)
Creatinine, Ser: 0.72 mg/dL (ref 0.44–1.00)
GFR, Estimated: >60 mL/min (ref >60)
Glucose, Bld: 107 mg/dL — ABNORMAL HIGH (ref 70–99)
Potassium: 3.5 mmol/L (ref 3.5–5.1)
Sodium: 136 mmol/L (ref 135–145)

## 2023-12-06 MED ORDER — TIZANIDINE HCL 4 MG PO TABS: 4.0000 mg | ORAL_TABLET | Freq: Four times a day (QID) | ORAL | 0 refills | Status: DC | PRN

## 2023-12-06 MED ORDER — ASPIRIN 81 MG PO CHEW: 81.0000 mg | CHEWABLE_TABLET | Freq: Two times a day (BID) | ORAL | 0 refills | Status: DC

## 2023-12-06 MED ORDER — OXYCODONE HCL 5 MG PO TABS: 5.0000 mg | ORAL_TABLET | Freq: Four times a day (QID) | ORAL | 0 refills | Status: DC | PRN

## 2023-12-06 NOTE — Progress Notes (Signed)
 Physical Therapy Treatment Patient Details Name: [name] MRN: [id number] DOB: Mar 18, 1961 Today's Date: 12/06/2023   History of Present Illness 63 y.o. female presents to [facility] hospital on 12/05/2023 for elective L THA. PMH includes hypothyroidism, lichen sclerosus, HSV, OA.    PT Comments  Pt seen for PT tx with pt agreeable. Pt requires min assist for supine>sit, reporting she has a makeshift leg lifter she utilizes at home. Pt ambulates into hallway with RW & CGA with 1 standing/pause break, pt noting dizziness that does not improve & ultimately returns to sitting in recliner. Pt noted to be + for orthostatic hypotension & when BP appears to drop pt c/o difficulty breathing, as if something is caught in her throat. PT provides reassurance & cuing for relaxation with pt demonstrating improvement & less gasping for air. Nurse made aware of vitals & events of session. Reviewed use of incentive spirometer with pt providing good return demo. Will continue to follow pt acutely to progress mobility as able. Pt would benefit from further gait & stair training prior to d/c.  SPO2 100% on room air  BP in LUE Sitting after gait:  109/57 mmHg MAP 71 Standing at 0: 82/56 mmHg MAP 65, HR 91 bpm - pt with c/o symptoms Standing at 1: 98/63 mmHg MAP 73, HR 92 bpm - pt reports feeling worse & requests to sit   If plan is discharge home, recommend the following: A little help with walking and/or transfers;A little help with bathing/dressing/bathroom;Assistance with cooking/housework;Assist for transportation;Help with stairs or ramp for entrance   Can travel by private vehicle        Equipment Recommendations  None recommended by PT    Recommendations for Other Services       Precautions / Restrictions Precautions Precautions: Fall Precaution/Restrictions Comments: direct anterior THA, no precautions Restrictions Weight Bearing Restrictions Per Provider Order: Yes LLE Weight Bearing Per  Provider Order: Weight bearing as tolerated     Mobility  Bed Mobility Overal bed mobility: Needs Assistance Bed Mobility: Supine to Sit     Supine to sit: Min assist, HOB elevated, Used rails (exit R side of bed, assistance to move LLE to EOB)          Transfers Overall transfer level: Needs assistance Equipment used: Rolling walker (2 wheels) Transfers: Sit to/from Stand Sit to Stand: Contact guard assist, Supervision           General transfer comment: extra time to power up, good ability to push to standing    Ambulation/Gait Ambulation/Gait assistance: Contact guard assist Gait Distance (Feet): 50 Feet Assistive device: Rolling walker (2 wheels) Gait Pattern/deviations: Decreased step length - right, Decreased step length - left, Decreased stride length, Decreased dorsiflexion - right Gait velocity: decreased         Stairs             Wheelchair Mobility     Tilt Bed    Modified Rankin (Stroke Patients Only)       Balance Overall balance assessment: Needs assistance Sitting-balance support: No upper extremity supported, Feet supported Sitting balance-Leahy Scale: Good     Standing balance support: Bilateral upper extremity supported, Reliant on assistive device for balance, During functional activity Standing balance-Leahy Scale: Fair                              Musician Communication: No apparent difficulties  Cognition Arousal: Alert Behavior During Therapy:  WFL for tasks assessed/performed, Anxious   PT - Cognitive impairments: No apparent impairments                         Following commands: Intact      Cueing Cueing Techniques: Verbal cues  Exercises      General Comments        Pertinent Vitals/Pain Pain Assessment Pain Assessment: 0-10 Pain Score: 7  Pain Location: L hip Pain Descriptors / Indicators: Sore Pain Intervention(s): Monitored during session, Limited  activity within patient's tolerance, Patient requesting pain meds-RN notified    Home Living                          Prior Function            PT Goals (current goals can now be found in the care plan section) Acute Rehab PT Goals Patient Stated Goal: to return to independence PT Goal Formulation: With patient/family Time For Goal Achievement: 12/09/23 Potential to Achieve Goals: Good Progress towards PT goals: Progressing toward goals    Frequency    7X/week      PT Plan      Co-evaluation              AM-PAC PT 6 Clicks Mobility   Outcome Measure  Help needed turning from your back to your side while in a flat bed without using bedrails?: A Little Help needed moving from lying on your back to sitting on the side of a flat bed without using bedrails?: A Little Help needed moving to and from a bed to a chair (including a wheelchair)?: A Little Help needed standing up from a chair using your arms (e.g., wheelchair or bedside chair)?: A Little Help needed to walk in hospital room?: A Little Help needed climbing 3-5 steps with a railing? : A Lot 6 Click Score: 17    End of Session Equipment Utilized During Treatment: Gait belt Activity Tolerance: Treatment limited secondary to medical complications (Comment) Patient left: in chair;with call bell/phone within reach Nurse Communication: Mobility status (BP during session, pt with c/o difficulty breathing at times) PT Visit Diagnosis: Other abnormalities of gait and mobility (R26.89);Muscle weakness (generalized) (M62.81);Pain Pain - Right/Left: Right Pain - part of body: Hip     Time: 0823-0849 PT Time Calculation (min) (ACUTE ONLY): 26 min  Charges:    $Therapeutic Activity: 23-37 mins PT General Charges $$ ACUTE PT VISIT: 1 Visit                     Richerd Pinal, PT, DPT 12/06/23, 8:59 AM    Richerd [name] 12/06/2023, 8:56 AM

## 2023-12-06 NOTE — Progress Notes (Signed)
 Subjective: 1 Day Post-Op Procedure(s) (LRB): ARTHROPLASTY, HIP, TOTAL, ANTERIOR APPROACH (Left) Patient reports pain as moderate.    Objective: Vital signs in last 24 hours: Temp:  [97.5 F (36.4 C)-98.7 F (37.1 C)] 98.2 F (36.8 C) (08/06 0433) Pulse Rate:  [68-85] 83 (08/06 0433) Resp:  [11-21] 14 (08/06 0433) BP: (110-153)/(45-85) 110/45 (08/06 0433) SpO2:  [83 %-100 %] 100 % (08/06 0433)  Intake/Output from previous day: 08/05 0701 - 08/06 0700 In: 1103.4 [I.V.:903.4; IV Piggyback:200] Out: 200 [Blood:200] Intake/Output this shift: No intake/output data recorded.  No results for input(s): HGB in the last 72 hours. No results for input(s): WBC, RBC, HCT, PLT in the last 72 hours. No results for input(s): NA, K, CL, CO2, BUN, CREATININE, GLUCOSE, CALCIUM  in the last 72 hours. No results for input(s): LABPT, INR in the last 72 hours.  Sensation intact distally Intact pulses distally Dorsiflexion/Plantar flexion intact Incision: scant drainage   Assessment/Plan: 1 Day Post-Op Procedure(s) (LRB): ARTHROPLASTY, HIP, TOTAL, ANTERIOR APPROACH (Left) Up with therapy Discharge home with home health later this afternoon      [name] 12/06/2023, 7:33 AM

## 2023-12-06 NOTE — Plan of Care (Signed)
   Problem: Activity: Goal: Risk for activity intolerance will decrease Outcome: Progressing

## 2023-12-06 NOTE — Plan of Care (Signed)
   Problem: Education: Goal: Knowledge of General Education information will improve Description: Including pain rating scale, medication(s)/side effects and non-pharmacologic comfort measures Outcome: Progressing   Problem: Activity: Goal: Risk for activity intolerance will decrease Outcome: Progressing   Problem: Pain Managment: Goal: General experience of comfort will improve and/or be controlled Outcome: Progressing

## 2023-12-06 NOTE — Discharge Instructions (Signed)
 Per [facility] clinic policy, our goal is ensure optimal postoperative pain control with a multimodal pain management strategy. For all [facility] patients, our goal is to wean post-operative narcotic medications by 6 weeks post-operatively. If this is not possible due to utilization of pain medication prior to surgery, your [facility] doctor will support your acute post-operative pain control for the first 6 weeks postoperatively, with a plan to transition you back to your primary pain team following that. [name] will work to ensure a Therapist, occupational.  INSTRUCTIONS AFTER JOINT REPLACEMENT   Remove items at home which could result in a fall. This includes throw rugs or furniture in walking pathways ICE to the affected joint every three hours while awake for 30 minutes at a time, for at least the first 3-5 days, and then as needed for pain and swelling.  Continue to use ice for pain and swelling. You may notice swelling that will progress down to the foot and ankle.  This is normal after surgery.  Elevate your leg when you are not up walking on it.   Continue to use the breathing machine you got in the hospital (incentive spirometer) which will help keep your temperature down.  It is common for your temperature to cycle up and down following surgery, especially at night when you are not up moving around and exerting yourself.  The breathing machine keeps your lungs expanded and your temperature down.   DIET:  As you were doing prior to hospitalization, we recommend a well-balanced diet.  DRESSING / WOUND CARE / SHOWERING  Keep the surgical dressing until follow up.  The dressing is water proof, so you can shower without any extra covering.  IF THE DRESSING FALLS OFF or the wound gets wet inside, change the dressing with sterile gauze.  Please use good hand washing techniques before changing the dressing.  Do not use any lotions or creams on the incision until instructed by your surgeon.     ACTIVITY  Increase activity slowly as tolerated, but follow the weight bearing instructions below.   No driving for 6 weeks or until further direction given by your physician.  You cannot drive while taking narcotics.  No lifting or carrying greater than 10 lbs. until further directed by your surgeon. Avoid periods of inactivity such as sitting longer than an hour when not asleep. This helps prevent blood clots.  You may return to work once you are authorized by your doctor.     WEIGHT BEARING   Weight bearing as tolerated with assist device (walker, cane, etc) as directed, use it as long as suggested by your surgeon or therapist, typically at least 4-6 weeks.   EXERCISES  Results after joint replacement surgery are often greatly improved when you follow the exercise, range of motion and muscle strengthening exercises prescribed by your doctor. Safety measures are also important to protect the joint from further injury. Any time any of these exercises cause you to have increased pain or swelling, decrease what you are doing until you are comfortable again and then slowly increase them. If you have problems or questions, call your caregiver or physical therapist for advice.   Rehabilitation is important following a joint replacement. After just a few days of immobilization, the muscles of the leg can become weakened and shrink (atrophy).  These exercises are designed to build up the tone and strength of the thigh and leg muscles and to improve motion. Often times heat used for twenty to thirty minutes before  working out will loosen up your tissues and help with improving the range of motion but do not use heat for the first two weeks following surgery (sometimes heat can increase post-operative swelling).   These exercises can be done on a training (exercise) mat, on the floor, on a table or on a bed. Use whatever works the best and is most comfortable for you.    Use music or television  while you are exercising so that the exercises are a pleasant break in your day. This will make your life better with the exercises acting as a break in your routine that you can look forward to.   Perform all exercises about fifteen times, three times per day or as directed.  You should exercise both the operative leg and the other leg as well.  Exercises include:   Quad Sets - Tighten up the muscle on the front of the thigh (Quad) and hold for 5-10 seconds.   Straight Leg Raises - With your knee straight (if you were given a brace, keep it on), lift the leg to 60 degrees, hold for 3 seconds, and slowly lower the leg.  Perform this exercise against resistance later as your leg gets stronger.  Leg Slides: Lying on your back, slowly slide your foot toward your buttocks, bending your knee up off the floor (only go as far as is comfortable). Then slowly slide your foot back down until your leg is flat on the floor again.  Angel Wings: Lying on your back spread your legs to the side as far apart as you can without causing discomfort.  Hamstring Strength:  Lying on your back, push your heel against the floor with your leg straight by tightening up the muscles of your buttocks.  Repeat, but this time bend your knee to a comfortable angle, and push your heel against the floor.  You may put a pillow under the heel to make it more comfortable if necessary.   A rehabilitation program following joint replacement surgery can speed recovery and prevent re-injury in the future due to weakened muscles. Contact your doctor or a physical therapist for more information on knee rehabilitation.    CONSTIPATION  Constipation is defined medically as fewer than three stools per week and severe constipation as less than one stool per week.  Even if you have a regular bowel pattern at home, your normal regimen is likely to be disrupted due to multiple reasons following surgery.  Combination of anesthesia, postoperative  narcotics, change in appetite and fluid intake all can affect your bowels.   YOU MUST use at least one of the following options; they are listed in order of increasing strength to get the job done.  They are all available over the counter, and you may need to use some, POSSIBLY even all of these options:    Drink plenty of fluids (prune juice may be helpful) and high fiber foods Colace 100 mg by mouth twice a day  Senokot for constipation as directed and as needed Dulcolax (bisacodyl), take with full glass of water  Miralax (polyethylene glycol) once or twice a day as needed.  If you have tried all these things and are unable to have a bowel movement in the first 3-4 days after surgery call either your surgeon or your primary doctor.    If you experience loose stools or diarrhea, hold the medications until you stool forms back up.  If your symptoms do not get better within 1 week  or if they get worse, check with your doctor.  If you experience "the worst abdominal pain ever" or develop nausea or vomiting, please contact the office immediately for further recommendations for treatment.   ITCHING:  If you experience itching with your medications, try taking only a single pain pill, or even half a pain pill at a time.  You can also use Benadryl over the counter for itching or also to help with sleep.   TED HOSE STOCKINGS:  Use stockings on both legs until for at least 2 weeks or as directed by physician office. They may be removed at night for sleeping.  MEDICATIONS:  See your medication summary on the "After Visit Summary" that nursing will review with you.  You may have some home medications which will be placed on hold until you complete the course of blood thinner medication.  It is important for you to complete the blood thinner medication as prescribed.  PRECAUTIONS:  If you experience chest pain or shortness of breath - call 911 immediately for transfer to the hospital emergency department.    If you develop a fever greater that 101 F, purulent drainage from wound, increased redness or drainage from wound, foul odor from the wound/dressing, or calf pain - CONTACT YOUR SURGEON.                                                   FOLLOW-UP APPOINTMENTS:  If you do not already have a post-op appointment, please call the office for an appointment to be seen by your surgeon.  Guidelines for how soon to be seen are listed in your "After Visit Summary", but are typically between 1-4 weeks after surgery.  OTHER INSTRUCTIONS:   Knee Replacement:  Do not place pillow under knee, focus on keeping the knee straight while resting. CPM instructions: 0-90 degrees, 2 hours in the morning, 2 hours in the afternoon, and 2 hours in the evening. Place foam block, curve side up under heel at all times except when in CPM or when walking.  DO NOT modify, tear, cut, or change the foam block in any way.  POST-OPERATIVE OPIOID TAPER INSTRUCTIONS: It is important to wean off of your opioid medication as soon as possible. If you do not need pain medication after your surgery it is ok to stop day one. Opioids include: Codeine, Hydrocodone(Norco, Vicodin), Oxycodone(Percocet, oxycontin) and hydromorphone amongst others.  Long term and even short term use of opiods can cause: Increased pain response Dependence Constipation Depression Respiratory depression And more.  Withdrawal symptoms can include Flu like symptoms Nausea, vomiting And more Techniques to manage these symptoms Hydrate well Eat regular healthy meals Stay active Use relaxation techniques(deep breathing, meditating, yoga) Do Not substitute Alcohol to help with tapering If you have been on opioids for less than two weeks and do not have pain than it is ok to stop all together.  Plan to wean off of opioids This plan should start within one week post op of your joint replacement. Maintain the same interval or time between taking each dose  and first decrease the dose.  Cut the total daily intake of opioids by one tablet each day Next start to increase the time between doses. The last dose that should be eliminated is the evening dose.   MAKE SURE YOU:  Understand these instructions.  Get help right away if you are not doing well or get worse.    Thank you for letting us be a part of your medical care team.  It is a privilege we respect greatly.  We hope these instructions will help you stay on track for a fast and full recovery!      Dental Antibiotics:  In most cases prophylactic antibiotics for Dental procdeures after total joint surgery are not necessary.  Exceptions are as follows:  1. History of prior total joint infection  2. Severely immunocompromised (Organ Transplant, cancer chemotherapy, Rheumatoid biologic meds such as Humera)  3. Poorly controlled diabetes (A1C &gt; 8.0, blood glucose over 200)  If you have one of these conditions, contact your surgeon for an antibiotic prescription, prior to your dental procedure.

## 2023-12-06 NOTE — Progress Notes (Signed)
 Physical Therapy Treatment Patient Details Name: [name] MRN: [id number] DOB: May 29, 1960 Today's Date: 12/06/2023   History of Present Illness 63 y.o. female presents to [facility] hospital on 12/05/2023 for elective L THA. PMH includes hypothyroidism, lichen sclerosus, HSV, OA.    PT Comments  Pt seen for PT tx with pt agreeable, family present for session. Pt requires min assist to move LLE for supine<>sit. Pt tolerates mobility well without c/o dizziness/lightheadedness. Pt ambulates in room, bathroom & hallway with RW & CGA.  Pt would benefit from further acute PT services to progress gait with LRAD & for stair training prior to d/c.   If plan is discharge home, recommend the following: A little help with walking and/or transfers;A little help with bathing/dressing/bathroom;Assistance with cooking/housework;Assist for transportation;Help with stairs or ramp for entrance   Can travel by private vehicle        Equipment Recommendations  None recommended by PT    Recommendations for Other Services       Precautions / Restrictions Precautions Precautions: Fall Recall of Precautions/Restrictions: Intact Precaution/Restrictions Comments: direct anterior THA, no precautions Restrictions Weight Bearing Restrictions Per Provider Order: Yes LLE Weight Bearing Per Provider Order: Weight bearing as tolerated     Mobility  Bed Mobility Overal bed mobility: Needs Assistance Bed Mobility: Supine to Sit, Sit to Supine     Supine to sit: Supervision, HOB elevated, Used rails Sit to supine: Supervision, HOB elevated, Used rails (uses homemade leg lifter to assist LLE)   General bed mobility comments: assistance to move LLE on/off EOB    Transfers Overall transfer level: Needs assistance Equipment used: Rolling walker (2 wheels) Transfers: Sit to/from Stand Sit to Stand: Contact guard assist, Supervision           General transfer comment: sit>stand from EOB, toilet with grab  bars, slightly elevated EOB    Ambulation/Gait Ambulation/Gait assistance: Contact guard assist Gait Distance (Feet): 80 Feet (+ 45 ft) Assistive device: Rolling walker (2 wheels) Gait Pattern/deviations: Decreased step length - right, Decreased step length - left, Decreased stride length, Decreased dorsiflexion - right Gait velocity: decreased     General Gait Details: cuing for upright posture & forward vs downward gaze with fair return demo   Stairs             Wheelchair Mobility     Tilt Bed    Modified Rankin (Stroke Patients Only)       Balance Overall balance assessment: Needs assistance Sitting-balance support: No upper extremity supported, Feet supported Sitting balance-Leahy Scale: Good     Standing balance support: Bilateral upper extremity supported, Reliant on assistive device for balance, During functional activity Standing balance-Leahy Scale: Fair                              Hotel manager: No apparent difficulties  Cognition Arousal: Alert Behavior During Therapy: WFL for tasks assessed/performed   PT - Cognitive impairments: No apparent impairments                         Following commands: Intact      Cueing Cueing Techniques: Verbal cues  Exercises      General Comments General comments (skin integrity, edema, etc.): Toileted with continent void, performed peri hygiene without assistance      Pertinent Vitals/Pain Pain Assessment Pain Assessment: Faces Pain Score: 7  Faces Pain Scale: Hurts even more  Pain Location: L hip Pain Descriptors / Indicators: Sore, Grimacing Pain Intervention(s): Monitored during session, Limited activity within patient's tolerance    Home Living                          Prior Function            PT Goals (current goals can now be found in the care plan section) Acute Rehab PT Goals Patient Stated Goal: to return to  independence PT Goal Formulation: With patient/family Time For Goal Achievement: 12/09/23 Potential to Achieve Goals: Good Progress towards PT goals: Progressing toward goals    Frequency    7X/week      PT Plan      Co-evaluation              AM-PAC PT 6 Clicks Mobility   Outcome Measure  Help needed turning from your back to your side while in a flat bed without using bedrails?: A Little Help needed moving from lying on your back to sitting on the side of a flat bed without using bedrails?: A Little Help needed moving to and from a bed to a chair (including a wheelchair)?: A Little Help needed standing up from a chair using your arms (e.g., wheelchair or bedside chair)?: A Little Help needed to walk in hospital room?: A Little Help needed climbing 3-5 steps with a railing? : A Lot 6 Click Score: 17    End of Session Equipment Utilized During Treatment: Gait belt Activity Tolerance: Patient tolerated treatment well Patient left: in bed;with call bell/phone within reach;with bed alarm set;with family/visitor present;with SCD's reapplied (ice pack on L hip) Nurse Communication: Mobility status PT Visit Diagnosis: Other abnormalities of gait and mobility (R26.89);Muscle weakness (generalized) (M62.81);Pain Pain - Right/Left: Left Pain - part of body: Hip     Time: 8695-8661 PT Time Calculation (min) (ACUTE ONLY): 34 min  Charges:    $Therapeutic Activity: 23-37 mins PT General Charges $$ ACUTE PT VISIT: 1 Visit                     Richerd Pinal, PT, DPT 12/06/23, 1:44 PM   Richerd [name] 12/06/2023, 1:43 PM

## 2023-12-07 DIAGNOSIS — M1612 Unilateral primary osteoarthritis, left hip: Secondary | ICD-10-CM | POA: Diagnosis not present

## 2023-12-07 NOTE — Plan of Care (Signed)
   Problem: Education: Goal: Knowledge of General Education information will improve Description Including pain rating scale, medication(s)/side effects and non-pharmacologic comfort measures Outcome: Progressing   Problem: Health Behavior/Discharge Planning: Goal: Ability to manage health-related needs will improve Outcome: Progressing

## 2023-12-07 NOTE — Progress Notes (Signed)
 Removed-CDI. Reviewed d/c paperwork with patient and answered questions. Wheeled stable patient and belongings to main entrance where she was picked up by her husband.

## 2023-12-07 NOTE — Plan of Care (Signed)
  Problem: Education: Goal: Knowledge of General Education information will improve Description: Including pain rating scale, medication(s)/side effects and non-pharmacologic comfort measures 12/07/2023 1238 by Delores Kirsch, RN Outcome: Adequate for Discharge 12/07/2023 1238 by Delores Kirsch, RN Outcome: Progressing   Problem: Health Behavior/Discharge Planning: Goal: Ability to manage health-related needs will improve 12/07/2023 1238 by Delores Kirsch, RN Outcome: Adequate for Discharge 12/07/2023 1238 by Delores Kirsch, RN Outcome: Progressing   Problem: Clinical Measurements: Goal: Ability to maintain clinical measurements within normal limits will improve Outcome: Adequate for Discharge Goal: Will remain free from infection Outcome: Adequate for Discharge Goal: Diagnostic test results will improve Outcome: Adequate for Discharge Goal: Respiratory complications will improve Outcome: Adequate for Discharge Goal: Cardiovascular complication will be avoided Outcome: Adequate for Discharge   Problem: Activity: Goal: Risk for activity intolerance will decrease Outcome: Adequate for Discharge   Problem: Nutrition: Goal: Adequate nutrition will be maintained Outcome: Adequate for Discharge   Problem: Coping: Goal: Level of anxiety will decrease Outcome: Adequate for Discharge   Problem: Elimination: Goal: Will not experience complications related to bowel motility Outcome: Adequate for Discharge Goal: Will not experience complications related to urinary retention Outcome: Adequate for Discharge   Problem: Pain Managment: Goal: General experience of comfort will improve and/or be controlled Outcome: Adequate for Discharge   Problem: Safety: Goal: Ability to remain free from injury will improve Outcome: Adequate for Discharge   Problem: Skin Integrity: Goal: Risk for impaired skin integrity will decrease Outcome: Adequate for Discharge   Problem: Education: Goal:  Knowledge of the prescribed therapeutic regimen will improve Outcome: Adequate for Discharge Goal: Understanding of discharge needs will improve Outcome: Adequate for Discharge Goal: Individualized Educational Video(s) Outcome: Adequate for Discharge   Problem: Activity: Goal: Ability to avoid complications of mobility impairment will improve Outcome: Adequate for Discharge Goal: Ability to tolerate increased activity will improve Outcome: Adequate for Discharge   Problem: Clinical Measurements: Goal: Postoperative complications will be avoided or minimized Outcome: Adequate for Discharge   Problem: Pain Management: Goal: Pain level will decrease with appropriate interventions Outcome: Adequate for Discharge   Problem: Skin Integrity: Goal: Will show signs of wound healing Outcome: Adequate for Discharge

## 2023-12-07 NOTE — Progress Notes (Signed)
 Physical Therapy Treatment Patient Details Name: [name] MRN: [id number] DOB: Sep 21, 1960 Today's Date: 12/07/2023   History of Present Illness 63 y.o. female presents to [facility] hospital on 12/05/2023 for elective L THA. PMH includes hypothyroidism, lichen sclerosus, HSV, OA.    PT Comments  Pt resting in bed on arrival, pleasant and eager for mobility and demonstrating good progress towards acute goals. Pt demonstrating improved activity tolerance, progressing gait distance with RW for support and grossly CGA for safety. Pt able to demonstrate safe stair ascent/descent with CGA for safety with good recall for sequencing. Pt continues to require light assist to manage LLE into/out of bed however pt reporting she sleeps in a recliner at baseline. Pt was educated on continued walker use to maximize functional independence, safety, and decrease risk for falls as well as ice, HEP and compliance, appropriate activity progression, safe car entry/exit and importance of continued mobility with pt verbalizing understanding. Anticipate safe discharge, with assist level outlined below, once medically cleared, will continue to follow acutely.     If plan is discharge home, recommend the following: A little help with walking and/or transfers;A little help with bathing/dressing/bathroom;Assistance with cooking/housework;Assist for transportation;Help with stairs or ramp for entrance   Can travel by private vehicle        Equipment Recommendations  None recommended by PT    Recommendations for Other Services       Precautions / Restrictions Precautions Precautions: Fall Recall of Precautions/Restrictions: Intact Precaution/Restrictions Comments: direct anterior THA, no precautions Restrictions Weight Bearing Restrictions Per Provider Order: Yes LLE Weight Bearing Per Provider Order: Weight bearing as tolerated     Mobility  Bed Mobility Overal bed mobility: Needs Assistance Bed Mobility: Supine  to Sit, Sit to Supine     Supine to sit: HOB elevated, Used rails, Contact guard Sit to supine: HOB elevated, Used rails, Min assist (uses homemade leg lifter to assist LLE)   General bed mobility comments: assistance to move LLE on/off EOB, pt now sleeps in lift chair    Transfers Overall transfer level: Needs assistance Equipment used: Rolling walker (2 wheels) Transfers: Sit to/from Stand Sit to Stand: Contact guard assist, Supervision           General transfer comment: sit>stand from EOB, toilet with grab bars, slightly elevated EOB    Ambulation/Gait Ambulation/Gait assistance: Contact guard assist Gait Distance (Feet): 110 Feet (x2) Assistive device: Rolling walker (2 wheels) Gait Pattern/deviations: Decreased step length - right, Decreased step length - left, Decreased stride length, Decreased dorsiflexion - right Gait velocity: decreased     General Gait Details: cuing for upright posture & forward vs downward gaze with fair return demo   Stairs Stairs: Yes Stairs assistance: Contact guard assist Stair Management: Two rails, Step to pattern Number of Stairs: 2 General stair comments: pt with good recall for step sequencing, CGA for safety   Wheelchair Mobility     Tilt Bed    Modified Rankin (Stroke Patients Only)       Balance Overall balance assessment: Needs assistance Sitting-balance support: No upper extremity supported, Feet supported Sitting balance-Leahy Scale: Good     Standing balance support: Bilateral upper extremity supported, Reliant on assistive device for balance, During functional activity Standing balance-Leahy Scale: Fair                              Musician Communication: No apparent difficulties  Cognition Arousal: Alert Behavior During  Therapy: WFL for tasks assessed/performed   PT - Cognitive impairments: No apparent impairments                         Following commands:  Intact      Cueing Cueing Techniques: Verbal cues  Exercises      General Comments General comments (skin integrity, edema, etc.): Toileted with continent void, performed peri hygiene without assistance      Pertinent Vitals/Pain Pain Assessment Pain Assessment: Faces Faces Pain Scale: Hurts a little bit Pain Location: L hip Pain Descriptors / Indicators: Sore, Grimacing Pain Intervention(s): Monitored during session, Limited activity within patient's tolerance    Home Living                          Prior Function            PT Goals (current goals can now be found in the care plan section) Acute Rehab PT Goals Patient Stated Goal: to return to independence PT Goal Formulation: With patient/family Time For Goal Achievement: 12/09/23 Progress towards PT goals: Progressing toward goals    Frequency    7X/week      PT Plan      Co-evaluation              AM-PAC PT 6 Clicks Mobility   Outcome Measure  Help needed turning from your back to your side while in a flat bed without using bedrails?: A Little Help needed moving from lying on your back to sitting on the side of a flat bed without using bedrails?: A Little Help needed moving to and from a bed to a chair (including a wheelchair)?: A Little Help needed standing up from a chair using your arms (e.g., wheelchair or bedside chair)?: A Little Help needed to walk in hospital room?: A Little Help needed climbing 3-5 steps with a railing? : A Little 6 Click Score: 18    End of Session   Activity Tolerance: Patient tolerated treatment well Patient left: in bed;with call bell/phone within reach;with family/visitor present;with SCD's reapplied Nurse Communication: Mobility status PT Visit Diagnosis: Other abnormalities of gait and mobility (R26.89);Muscle weakness (generalized) (M62.81);Pain Pain - Right/Left: Left Pain - part of body: Hip     Time: 9057-8980 PT Time Calculation (min)  (ACUTE ONLY): 37 min  Charges:    $Gait Training: 23-37 mins PT General Charges $$ ACUTE PT VISIT: 1 Visit                     Marquavious Nazar R. PTA Acute Rehabilitation Services Office: [phone]   Therisa CHRISTELLA Boor 12/07/2023, 10:28 AM

## 2023-12-07 NOTE — Progress Notes (Signed)
 Subjective: 2 Days Post-Op Procedure(s) (LRB): ARTHROPLASTY, HIP, TOTAL, ANTERIOR APPROACH (Left) Patient reports pain as moderate.   Worked well with therapy this morning.  Objective: Vital signs in last 24 hours: Temp:  [98.2 F (36.8 C)-100.2 F (37.9 C)] 98.2 F (36.8 C) (08/07 0756) Pulse Rate:  [80-94] 94 (08/07 0756) Resp:  [16-17] 16 (08/07 0756) BP: (113-127)/(46-70) 124/70 (08/07 0756) SpO2:  [94 %-100 %] 94 % (08/07 0756)  Intake/Output from previous day: No intake/output data recorded. Intake/Output this shift: No intake/output data recorded.  Recent Labs    12/06/23 0659  HGB 10.1*   Recent Labs    12/06/23 0659  WBC 6.9  RBC 3.44*  HCT 30.5*  PLT 200   Recent Labs    12/06/23 0659  NA 136  K 3.5  CL 104  CO2 24  BUN 16  CREATININE 0.72  GLUCOSE 107*  CALCIUM  8.6*   No results for input(s): LABPT, INR in the last 72 hours.  Sensation intact distally Intact pulses distally Dorsiflexion/Plantar flexion intact Incision: scant drainage   Assessment/Plan: 2 Days Post-Op Procedure(s) (LRB): ARTHROPLASTY, HIP, TOTAL, ANTERIOR APPROACH (Left) Discharge home with home health      [name] 12/07/2023, 10:29 AM

## 2023-12-07 NOTE — TOC Transition Note (Addendum)
 Transition of Care [facility]) - Discharge Note   Patient Details  Name: [name] MRN: [id number] Date of Birth: May 25, 1960  [facility] CM/SW Contact:  Rosalva Jon Bloch, RN Phone Number: 12/07/2023, 10:54 AM   Clinical Narrative:    Patient will DC to: home Anticipated DC date: 12/07/2023 Family notified: yes Transport by: car      - s/p  total replacement of left hip 8/5 Per MD patient ready for DC today. RN, patient, patient's family, and  [facility]  (prearranged by provider's office) notified of DC. Referral made for [facility] with [facility]. Equipment will be delivered to bedside prior to d/c. Pt states has RW @ home. Pt without RX med concerns. Pt will pick up meds from local pharmacy. Post hospital f/u noted on AVS.  Husband to provide transportation to home.  RNCM will sign off for now as intervention is no longer needed. Please consult us  again if new needs arise.    Final next level of care: Home w Home Health Services Barriers to Discharge: Continued Medical Work up   Patient Goals and CMS Choice     Choice offered to / list presented to : Patient      Discharge Placement                       Discharge Plan and Services Additional resources added to the After Visit Summary for     Discharge Planning Services: CM Consult            DME Arranged: Bedside commode DME Agency: [facility] Date DME Agency Contacted: 12/07/23 Time DME Agency Contacted: 1054 Representative spoke with at DME Agency: Darlyn HH Arranged: PT HH Agency: [facility] Date [facility] Agency Contacted: 12/06/23 Time HH Agency Contacted: 1506 Representative spoke with at [facility] Agency: Arna  Social Drivers of Health (SDOH) Interventions SDOH Screenings   Food Insecurity: No Food Insecurity (12/05/2023)  Housing: Low Risk  (12/05/2023)  Transportation Needs: No Transportation Needs (12/05/2023)  Utilities: Not At Risk (12/05/2023)  Tobacco Use: Low Risk  (12/05/2023)      Readmission Risk Interventions     No data to display

## 2023-12-07 NOTE — Discharge Summary (Signed)
 Patient ID: [name] MRN: [id number] DOB/AGE: 61-Aug-1962 61 y.o.  Admit date: 12/05/2023 Discharge date: 12/07/2023  Admission Diagnoses:  Principal Problem:   Unilateral primary osteoarthritis, left hip Active Problems:   Status post total replacement of left hip   Discharge Diagnoses:  Same  Past Medical History:  Diagnosis Date   Abnormal Pap smear    gland. cells -> colpo -> neg endo bx, CIN I   Arthritis    Hyperlipidemia    Hypothyroidism    STD (sexually transmitted disease)    HSV    Surgeries: Procedure(s): ARTHROPLASTY, HIP, TOTAL, ANTERIOR APPROACH on 12/05/2023   Consultants:   Discharged Condition: Improved  Hospital Course: [name] is an 63 y.o. female who was admitted 12/05/2023 for operative treatment ofUnilateral primary osteoarthritis, left hip. Patient has severe unremitting pain that affects sleep, daily activities, and work/hobbies. After pre-op clearance the patient was taken to the operating room on 12/05/2023 and underwent  Procedure(s): ARTHROPLASTY, HIP, TOTAL, ANTERIOR APPROACH.    Patient was given perioperative antibiotics:  Anti-infectives (From admission, onward)    Start     Dose/Rate Route Frequency Ordered Stop   12/05/23 1345  ceFAZolin  (ANCEF ) IVPB 2g/100 mL premix        2 g 200 mL/hr over 30 Minutes Intravenous Every 6 hours 12/05/23 1123 12/05/23 2006   12/05/23 0600  ceFAZolin  (ANCEF ) IVPB 2g/100 mL premix        2 g 200 mL/hr over 30 Minutes Intravenous On call to O.R. 12/05/23 9448 12/05/23 0803        Patient was given sequential compression devices, early ambulation, and chemoprophylaxis to prevent DVT.  Inpatient Morphine Milligram Equivalents Per Day 8/5 - 8/7   Values displayed are in units of MME/Day    Order Start / End Date 8/5 Yesterday Today    oxyCODONE  (Oxy IR/ROXICODONE ) immediate release tablet 5 mg 8/5 - 8/5 7.5 of Unknown -- --    oxyCODONE  (ROXICODONE ) 5 MG/5ML solution 5 mg 8/5 - 8/5 0 of  Unknown -- --      Group total: 7.5 of Unknown      fentaNYL  (SUBLIMAZE ) injection 25-50 mcg 8/5 - 8/5 45 of 45-90 -- --    fentaNYL  citrate (PF) (SUBLIMAZE ) injection 8/5 - 8/5 *112.5 of 112.5 -- --    HYDROmorphone  (DILAUDID ) injection 0.5-1 mg 8/5 - No end date 40 of 70-140 40 of 120-240 0 of 120-240    oxyCODONE  (Oxy IR/ROXICODONE ) immediate release tablet 5-10 mg 8/5 - No end date 22.5 of 30-60 15 of 45-90 0 of 45-90    oxyCODONE  (Oxy IR/ROXICODONE ) immediate release tablet 10-15 mg 8/5 - No end date 0 of 60-90 67.5 of 90-135 45 of 90-135    HYDROmorphone  (DILAUDID ) injection 0.25-0.5 mg 8/5 - 8/5 40 of 40-80 -- --    Daily Totals  * 267.5 of Unknown (at least 357.5-572.5) 122.5 of 255-465 45 of 255-465  *One-Step medication  Calculation Errors     Order Type Date Details   oxyCODONE  (Oxy IR/ROXICODONE ) immediate release tablet 5 mg Ordered Dose -- Insufficient frequency information   oxyCODONE  (ROXICODONE ) 5 MG/5ML solution 5 mg Ordered Dose -- Insufficient frequency information            Patient benefited maximally from hospital stay and there were no complications.    Recent vital signs: Patient Vitals for the past 24 hrs:  BP Temp Temp src Pulse Resp SpO2  12/07/23 0756 124/70 98.2 F (36.8 C)  Oral 94 16 94 %  12/07/23 0356 (!) 127/57 98.5 F (36.9 C) Oral 80 17 97 %  12/06/23 2015 (!) 127/56 100.2 F (37.9 C) Oral 87 17 97 %  12/06/23 1421 -- 98.2 F (36.8 C) -- -- -- 100 %  12/06/23 1402 (!) 113/46 98.2 F (36.8 C) Oral 81 -- 100 %     Recent laboratory studies:  Recent Labs    12/06/23 0659  WBC 6.9  HGB 10.1*  HCT 30.5*  PLT 200  NA 136  K 3.5  CL 104  CO2 24  BUN 16  CREATININE 0.72  GLUCOSE 107*  CALCIUM  8.6*     Discharge Medications:   Allergies as of 12/07/2023       Reactions   Codeine    Erythromycin    Vicodin [hydrocodone-acetaminophen ]    Faint, hot and sweaty        Medication List     STOP taking these medications     HYDROcodone-acetaminophen  10-325 MG tablet Commonly known as: NORCO       TAKE these medications    acetaminophen  650 MG CR tablet Commonly known as: TYLENOL  Take 1,300 mg by mouth in the morning and at bedtime.   ACIDOPHILUS PO Take 1 capsule by mouth 4 (four) times daily as needed (pain).   aspirin  81 MG chewable tablet Chew 1 tablet (81 mg total) by mouth 2 (two) times daily.   atorvastatin  20 MG tablet Commonly known as: LIPITOR Take 20 mg by mouth daily.   Biotin  1000 MCG tablet Take 1,000 mcg by mouth daily.   CALCIUM  600 PO Take 600 mg by mouth daily.   clobetasol  ointment 0.05 % Commonly known as: TEMOVATE  Apply 1 application topically 2 (two) times daily. Apply as directed twice daily   FeroSul 325 (65 Fe) MG tablet Generic drug: ferrous sulfate  Take 325 mg by mouth daily.   levothyroxine  100 MCG tablet Commonly known as: SYNTHROID  Take 200-300 mcg by mouth See admin instructions. Take 200 mcg by mouth in the morning and take 300 mcg on Sunday   MULTIVITAMIN PO Take 1 tablet by mouth daily.   oxyCODONE  5 MG immediate release tablet Commonly known as: Oxy IR/ROXICODONE  Take 1-2 tablets (5-10 mg total) by mouth every 6 (six) hours as needed for moderate pain (pain score 4-6) (pain score 4-6). No more than 6 tablets per day   tiZANidine  4 MG tablet Commonly known as: ZANAFLEX  Take 1 tablet (4 mg total) by mouth every 6 (six) hours as needed for muscle spasms.   valACYclovir  500 MG tablet Commonly known as: VALTREX  TAKE ONE TABLET BY MOUTH TWICE DAILY FOR 3 DAYS STARTING WITH ONSET OF SYMPTOMS   VITAMIN D  (CHOLECALCIFEROL) PO Take 1 tablet by mouth daily.   Zepbound 10 MG/0.5ML Pen Generic drug: tirzepatide Inject 10 mg into the skin every Sunday.               Durable Medical Equipment  (From admission, onward)           Start     Ordered   12/07/23 0845  DME 3 n 1  Once       Comments: BSC confined to one room   12/07/23 0845    12/05/23 1124  DME Walker rolling  Once       Question Answer Comment  Walker: With 5 Inch Wheels   Patient needs a walker to treat with the following condition Status post total replacement of left hip  12/05/23 1123            Diagnostic Studies: DG HIP UNILAT WITH PELVIS 1V LEFT Result Date: 12/05/2023 CLINICAL DATA:  Left total hip arthroplasty. EXAM: DG HIP (WITH OR WITHOUT PELVIS) 1V*L*; PORTABLE PELVIS 1-2 VIEWS COMPARISON:  Radiographs 05/31/2023. FINDINGS: C-arm fluoroscopy was provided in the operating room without the presence of a radiologist.24.4 seconds fluoroscopy time. 3.05 mGy air kerma. Six C-arm fluoroscopic images were obtained intraoperatively and are submitted for post operative interpretation. These images demonstrate performance of a left total hip arthroplasty. No complications identified. Please see intraoperative findings for further detail. AP pelvis: Postoperative examination at 0958 hrs excludes the upper pelvis. The left total hip arthroplasty appears well positioned. No evidence of acute fracture, dislocation or other complication on single AP view. IMPRESSION: Intraoperative views during left total hip arthroplasty as described. No demonstrated complication on postoperative image. Electronically Signed   By: Elsie Perone M.D.   On: 12/05/2023 12:50   DG Pelvis Portable Result Date: 12/05/2023 CLINICAL DATA:  Left total hip arthroplasty. EXAM: DG HIP (WITH OR WITHOUT PELVIS) 1V*L*; PORTABLE PELVIS 1-2 VIEWS COMPARISON:  Radiographs 05/31/2023. FINDINGS: C-arm fluoroscopy was provided in the operating room without the presence of a radiologist.24.4 seconds fluoroscopy time. 3.05 mGy air kerma. Six C-arm fluoroscopic images were obtained intraoperatively and are submitted for post operative interpretation. These images demonstrate performance of a left total hip arthroplasty. No complications identified. Please see intraoperative findings for further detail. AP  pelvis: Postoperative examination at 0958 hrs excludes the upper pelvis. The left total hip arthroplasty appears well positioned. No evidence of acute fracture, dislocation or other complication on single AP view. IMPRESSION: Intraoperative views during left total hip arthroplasty as described. No demonstrated complication on postoperative image. Electronically Signed   By: Elsie Perone M.D.   On: 12/05/2023 12:50   DG C-Arm 1-60 Min-No Report Result Date: 12/05/2023 Fluoroscopy was utilized by the requesting physician.  No radiographic interpretation.   DG C-Arm 1-60 Min-No Report Result Date: 12/05/2023 Fluoroscopy was utilized by the requesting physician.  No radiographic interpretation.    Disposition: Discharge disposition: 01-Home or Self Care          Follow-up Information     Vernetta Lonni GRADE, MD Follow up in 2 week(s).   Specialty: Orthopedic Surgery Contact information: [address] [place] [address] [phone]         Teresa Channel, MD Follow up.   Specialty: Family Medicine Contact information: [address] [place] [address] [phone]         [facility] Follow up.   Specialty: Home Health Services Why: HOME HEALTH SERVICES WILL BE PROVIDED BY [facility] Contact information: [address] [place] [address] [phone]                  Signed: Lonni GRADE Vernetta 12/07/2023, 10:30 AM

## 2023-12-20 ENCOUNTER — Ambulatory Visit (INDEPENDENT_AMBULATORY_CARE_PROVIDER_SITE_OTHER): Admitting: Orthopaedic Surgery

## 2023-12-20 ENCOUNTER — Encounter: Payer: Self-pay | Admitting: Orthopaedic Surgery

## 2023-12-20 DIAGNOSIS — Z96642 Presence of left artificial hip joint: Secondary | ICD-10-CM

## 2023-12-20 NOTE — Progress Notes (Signed)
 The patient is here for first postoperative visit status post a left total hip arthroplasty 2 weeks ago.  She reports good range of motion and strength.  She is ambulating with a rolling walker.  She feels like she can get back to work sooner than what she is scheduled to be out via [employer].  Her left hip incision looks good.  The staples are removed and Steri-Strips applied.  Her leg lengths appear equal.  She is pleased overall.  She will continue with her rolling walker and transition to a cane when she feels comfortable.  She can stop her baby aspirin .  She is already off narcotics so she can drive from our standpoint.  I am fine with her returning to work duties without restrictions starting Monday, January 08, 2024.  From our standpoint we will see her back in 4 weeks from now to see how she is doing but no x-rays are needed.

## 2024-01-17 ENCOUNTER — Encounter: Payer: Self-pay | Admitting: Orthopaedic Surgery

## 2024-01-17 ENCOUNTER — Ambulatory Visit (INDEPENDENT_AMBULATORY_CARE_PROVIDER_SITE_OTHER): Admitting: Orthopaedic Surgery

## 2024-01-17 DIAGNOSIS — Z96642 Presence of left artificial hip joint: Secondary | ICD-10-CM

## 2024-01-17 NOTE — Progress Notes (Signed)
 The patient is now 6 weeks status post a left total hip arthroplasty to treat significant left hip pain and arthritis.  She is ambulating with a quad cane and doing very well overall.  She reports better range of motion and strength.  She has significant end-stage bone-on-bone arthritis of her left hip but she had such a high BMI that we were not able to appropriately for surgery until she lost more weight which she finally did.  We are very proud of her and how she is doing things overall.  She is back to work as well.  She does deal with significant arthritis in her right knee.  On exam the left knee moves smoothly and fluidly and her incision looks great.  Her right knee does have a large soft tissue envelope around both knees but no effusion and painful range of motion with varus malalignment.  She will continue to increase her activities.  She would like to lose more weight before considering knee replacement surgery.  We will see her back in 3 months.  At that visit I would like a standing AP pelvis and a lateral of the left hip as well as an AP and lateral of the right knee.  I would also like to have her weight and BMI assessed at that visit as well.

## 2024-02-27 ENCOUNTER — Telehealth: Payer: Self-pay | Admitting: Orthopaedic Surgery

## 2024-02-27 NOTE — Telephone Encounter (Signed)
 Patient called and wants to know that being that she met her deductible can she get her knee surgery by the end of the year.[phone]

## 2024-02-29 NOTE — Telephone Encounter (Signed)
 I called patient and left voice mail message for return call.

## 2024-03-04 ENCOUNTER — Encounter: Payer: Self-pay | Admitting: Radiology

## 2024-03-04 ENCOUNTER — Ambulatory Visit: Admitting: Orthopaedic Surgery

## 2024-03-04 ENCOUNTER — Other Ambulatory Visit

## 2024-03-04 VITALS — Ht 65.0 in | Wt 230.0 lb

## 2024-03-04 DIAGNOSIS — M1711 Unilateral primary osteoarthritis, right knee: Secondary | ICD-10-CM | POA: Diagnosis not present

## 2024-03-04 DIAGNOSIS — G8929 Other chronic pain: Secondary | ICD-10-CM

## 2024-03-04 DIAGNOSIS — M25561 Pain in right knee: Secondary | ICD-10-CM | POA: Diagnosis not present

## 2024-03-04 NOTE — Progress Notes (Signed)
 The patient is a 63 year old active female who is a regular patient of ours.  We replaced her left hip this year secondary to severe end-stage arthritis.  She has been dealing with years of right knee pain and known arthritis in the right knee and would like to discuss knee replacement surgery.  She is active.  Her BMI is down to 38.27.  She is not on blood thinning medications either.  She reports daily right knee pain that is 10 out of 10.  It is detrimentally affecting her mobility, quality of life and actives daily living.  She has had multiple forms of conservative treatment on this knee for many years now.  On examination of her right knee there is medial joint line tenderness and patellofemoral crepitation and patellofemoral pain.  She has good range of motion of the knee.  There is a very large soft tissue envelope around her right knee.  X-rays of her right knee show severe end-stage tricompartment arthritis mainly involving the medial joint line and patellofemoral joint with osteophytes in all 3 compartments and significant joint space narrowing.  We had a long and thorough discussion about knee replacement surgery.  She understands that hers will be significantly more difficult for the surgeon based on the large soft tissue envelope around her knee.  I did show her knee replacement model and we talked about the risks and benefits of surgery and she does have a heightened risk for problems as a relates to complications given her weight around the knee.  I showed her knee replacement model and discussed what to expect from an intraoperative and postoperative standpoint and discussed with the surgery involves in detail.  All question concerns were answered addressed.  She does wish proceed with a knee replacement before the end of the year.

## 2024-03-13 ENCOUNTER — Telehealth: Payer: Self-pay | Admitting: Orthopaedic Surgery

## 2024-03-13 NOTE — Telephone Encounter (Signed)
 Pt submitted medical release form. FMLA, and $20.00

## 2024-03-25 ENCOUNTER — Other Ambulatory Visit: Payer: Self-pay | Admitting: Physician Assistant

## 2024-03-25 ENCOUNTER — Ambulatory Visit: Admitting: Orthopaedic Surgery

## 2024-03-25 DIAGNOSIS — Z01818 Encounter for other preprocedural examination: Secondary | ICD-10-CM

## 2024-04-03 ENCOUNTER — Encounter: Payer: Self-pay | Admitting: Orthopaedic Surgery

## 2024-04-05 NOTE — Pre-Procedure Instructions (Signed)
 Surgical Instructions   Your procedure is scheduled on April 09, 2024. Report to [facility] Main Entrance A at 5:30 A.M., then check in with the Admitting office. Any questions or running late day of surgery: call [phone]  Questions prior to your surgery date: call [phone], Monday-Friday, 8am-4pm. If you experience any cold or flu symptoms such as cough, fever, chills, shortness of breath, etc. between now and your scheduled surgery, please notify us  at the above number.     Remember:  Do not eat after midnight the night before your surgery   You may drink clear liquids until 4:30 AM the morning of your surgery.   Clear liquids allowed are: Water, Non-Citrus Juices (without pulp), Carbonated Beverages, Clear Tea (no milk, honey, etc.), Black Coffee Only (NO MILK, CREAM OR POWDERED CREAMER of any kind), and Gatorade.  Patient Instructions  The night before surgery:  No food after midnight. ONLY clear liquids after midnight  The day of surgery (if you do NOT have diabetes):  Drink ONE (1) Pre-Surgery Clear Ensure by 4:30 AM the morning of surgery. Drink in one sitting. Do not sip.  This drink was given to you during your hospital  pre-op appointment visit.  Nothing else to drink after completing the  Pre-Surgery Clear Ensure.         If you have questions, please contact your surgeon's office.    Take these medicines the morning of surgery with A SIP OF WATER: atorvastatin  (LIPITOR)  levothyroxine  (SYNTHROID , LEVOTHROID)    May take these medicines IF NEEDED: acetaminophen  (TYLENOL )    One week prior to surgery, STOP taking any Aspirin  (unless otherwise instructed by your surgeon) Aleve, Naproxen, Ibuprofen, Motrin, Advil, Goody's, BC's, all herbal medications, fish oil, and non-prescription vitamins.                     Do NOT Smoke (Tobacco/Vaping) for 24 hours prior to your procedure.  If you use a CPAP at night, you may bring your mask/headgear for your  overnight stay.   You will be asked to remove any contacts, glasses, piercing's, hearing aid's, dentures/partials prior to surgery. Please bring cases for these items if needed.    Patients discharged the day of surgery will not be allowed to drive home, and someone needs to stay with them for 24 hours.  SURGICAL WAITING ROOM VISITATION Patients may have no more than 2 support people in the waiting area - these visitors may rotate.   Pre-op nurse will coordinate an appropriate time for 1 ADULT support person, who may not rotate, to accompany patient in pre-op.  Children under the age of 35 must have an adult with them who is not the patient and must remain in the main waiting area with an adult.  If the patient needs to stay at the hospital during part of their recovery, the visitor guidelines for inpatient rooms apply.  Please refer to the [facility] website for the visitor guidelines for any additional information.   If you received a COVID test during your pre-op visit  it is requested that you wear a mask when out in public, stay away from anyone that may not be feeling well and notify your surgeon if you develop symptoms. If you have been in contact with anyone that has tested positive in the last 10 days please notify you surgeon.      Pre-operative CHG Bathing Instructions   You can play a key role in reducing the  risk of infection after surgery. Your skin needs to be as free of germs as possible. You can reduce the number of germs on your skin by washing with CHG (chlorhexidine  gluconate) soap before surgery. CHG is an antiseptic soap that kills germs and continues to kill germs even after washing.   DO NOT use if you have an allergy to chlorhexidine /CHG or antibacterial soaps. If your skin becomes reddened or irritated, stop using the CHG and notify one of our RNs at [phone].              TAKE A SHOWER THE NIGHT BEFORE SURGERY   Please keep in mind the following:  DO NOT  shave, including legs and underarms, 48 hours prior to surgery.   You may shave your face before/day of surgery.  Place clean sheets on your bed the night before surgery Use a clean washcloth (not used since being washed) for shower. DO NOT sleep with pet's night before surgery.  CHG Shower Instructions:  Wash your face and private area with normal soap. If you choose to wash your hair, wash first with your normal shampoo.  After you use shampoo/soap, rinse your hair and body thoroughly to remove shampoo/soap residue.  Turn the water OFF and apply half the bottle of CHG soap to a CLEAN washcloth.  Apply CHG soap ONLY FROM YOUR NECK DOWN TO YOUR TOES (washing for 3-5 minutes)  DO NOT use CHG soap on face, private areas, open wounds, or sores.  Pay special attention to the area where your surgery is being performed.  If you are having back surgery, having someone wash your back for you may be helpful. Wait 2 minutes after CHG soap is applied, then you may rinse off the CHG soap.  Pat dry with a clean towel  Put on clean pajamas    Additional instructions for the day of surgery: If you choose, you may shower the morning of surgery with an antibacterial soap.  DO NOT APPLY any lotions, deodorants, cologne, or perfumes.   Do not wear jewelry or makeup Do not wear nail polish, gel polish, artificial nails, or any other type of covering on natural nails (fingers and toes) Do not bring valuables to the hospital. [facility] is not responsible for valuables/personal belongings. Put on clean/comfortable clothes.  Please brush your teeth.  Ask your nurse before applying any prescription medications to the skin.

## 2024-04-08 ENCOUNTER — Inpatient Hospital Stay (HOSPITAL_COMMUNITY): Admission: RE | Admit: 2024-04-08 | Discharge: 2024-04-08 | Attending: Orthopaedic Surgery

## 2024-04-08 ENCOUNTER — Other Ambulatory Visit: Payer: Self-pay

## 2024-04-08 ENCOUNTER — Encounter (HOSPITAL_COMMUNITY): Payer: Self-pay

## 2024-04-08 VITALS — BP 140/67 | HR 62 | Temp 97.9°F | Resp 18 | Ht 65.0 in | Wt 228.7 lb

## 2024-04-08 DIAGNOSIS — Z01818 Encounter for other preprocedural examination: Secondary | ICD-10-CM

## 2024-04-08 DIAGNOSIS — M1711 Unilateral primary osteoarthritis, right knee: Principal | ICD-10-CM | POA: Insufficient documentation

## 2024-04-08 LAB — CBC
HCT: 40.5 % (ref 36.0–46.0)
Hemoglobin: 12.8 g/dL (ref 12.0–15.0)
MCH: 27.2 pg (ref 26.0–34.0)
MCHC: 31.6 g/dL (ref 30.0–36.0)
MCV: 86 fL (ref 80.0–100.0)
Platelets: 272 K/uL (ref 150–400)
RBC: 4.71 MIL/uL (ref 3.87–5.11)
RDW: 14.9 % (ref 11.5–15.5)
WBC: 5.7 K/uL (ref 4.0–10.5)
nRBC: 0 % (ref 0.0–0.2)

## 2024-04-08 LAB — TYPE AND SCREEN
ABO/RH(D): A POS
Antibody Screen: NEGATIVE

## 2024-04-08 LAB — BASIC METABOLIC PANEL WITH GFR
Anion gap: 7 (ref 5–15)
BUN: 26 mg/dL — ABNORMAL HIGH (ref 8–23)
CO2: 26 mmol/L (ref 22–32)
Calcium: 8.8 mg/dL — ABNORMAL LOW (ref 8.9–10.3)
Chloride: 105 mmol/L (ref 98–111)
Creatinine, Ser: 0.61 mg/dL (ref 0.44–1.00)
GFR, Estimated: >60 mL/min
Glucose, Bld: 92 mg/dL (ref 70–99)
Potassium: 3.8 mmol/L (ref 3.5–5.1)
Sodium: 138 mmol/L (ref 135–145)

## 2024-04-08 LAB — SURGICAL PCR SCREEN
MRSA, PCR: NEGATIVE
Staphylococcus aureus: NEGATIVE

## 2024-04-08 NOTE — Progress Notes (Signed)
 PCP - Dr. Montie Pizza Cardiologist - Denies   PPM/ICD - Denies Device Orders - n/a Rep Notified - n/a   Chest x-ray - n/a EKG - Denies Stress Test - Denies ECHO - 07/01/2003 Cardiac Cath - Denies   Sleep Study - Denies CPAP - n/a   No DM   Last dose of GLP1 agonist- n/a GLP1 instructions:    Blood Thinner Instructions: n/a Aspirin  Instructions: n/a   ERAS Protcol - Clear liquids until 0430 morning of surgery PRE-SURGERY Ensure or G2- Ensure given to pt with instructions   COVID TEST- n/a     Anesthesia review: No    Patient denies shortness of breath, fever, cough and chest pain at PAT appointment. Pt denies any respiratory illness/infection in the last two months.

## 2024-04-08 NOTE — H&P (Signed)
 TOTAL KNEE ADMISSION H&P  Patient is being admitted for right total knee arthroplasty.  Subjective:  Chief Complaint:right knee pain.  HPI: [name], 63 y.o. female, has a history of pain and functional disability in the right knee due to arthritis and has failed non-surgical conservative treatments for greater than 12 weeks to includeNSAID's and/or analgesics, corticosteriod injections, viscosupplementation injections, flexibility and strengthening excercises, supervised PT with diminished ADL's post treatment, use of assistive devices, weight reduction as appropriate, and activity modification.  Onset of symptoms was gradual, starting a few years ago with gradually worsening course since that time. The patient noted no past surgery on the right knee(s).  Patient currently rates pain in the right knee(s) at 10 out of 10 with activity. Patient has night pain, worsening of pain with activity and weight bearing, pain that interferes with activities of daily living, pain with passive range of motion, crepitus, and joint swelling.  Patient has evidence of subchondral sclerosis, periarticular osteophytes, and joint space narrowing by imaging studies. There is no active infection.  Patient Active Problem List   Diagnosis Date Noted   Unilateral primary osteoarthritis, right knee 04/08/2024   Status post total replacement of left hip 12/05/2023   Lichen sclerosus 06/04/2019   HSV (herpes simplex virus) infection 11/25/2016   Hypothyroid 11/25/2016   Thyroid  nodule 07/27/2015   Past Medical History:  Diagnosis Date   Abnormal Pap smear    gland. cells -> colpo -> neg endo bx, CIN I   Arthritis    Hyperlipidemia    Hypothyroidism    STD (sexually transmitted disease)    HSV    Past Surgical History:  Procedure Laterality Date   CESAREAN SECTION  1996   COLONOSCOPY     GASTRIC BYPASS  08/2003   TOTAL HIP ARTHROPLASTY Left 12/05/2023   Procedure: ARTHROPLASTY, HIP, TOTAL, ANTERIOR  APPROACH;  Surgeon: Vernetta Lonni GRADE, MD;  Location: [facility] OR;  Service: Orthopedics;  Laterality: Left;   VAGINAL DELIVERY  1994    No current facility-administered medications for this encounter.   Current Outpatient Medications  Medication Sig Dispense Refill Last Dose/Taking   acetaminophen  (TYLENOL ) 650 MG CR tablet Take 1,300 mg by mouth every 8 (eight) hours as needed for pain.   Taking As Needed   atorvastatin  (LIPITOR) 20 MG tablet Take 20 mg by mouth daily.   Taking   Biotin  1000 MCG tablet Take 1,000 mcg by mouth daily.   Taking   Calcium  Carbonate (CALCIUM  600 PO) Take 600 mg by mouth daily.    Taking   FEROSUL 325 (65 Fe) MG tablet Take 325 mg by mouth daily.   Taking   Lactobacillus (ACIDOPHILUS PO) Take 1 capsule by mouth daily.   Taking   levothyroxine  (SYNTHROID , LEVOTHROID) 100 MCG tablet Take 200-300 mcg by mouth See admin instructions. Take 200 mcg by mouth in the morning and take 300 mcg on Sunday   Taking   lisdexamfetamine (VYVANSE ) 30 MG capsule Take 30 mg by mouth every morning.   Taking   Multiple Vitamins-Minerals (MULTIVITAMIN PO) Take 1 tablet by mouth daily.   Taking   VITAMIN D , CHOLECALCIFEROL, PO Take 1 tablet by mouth daily.   Taking   aspirin  81 MG chewable tablet Chew 1 tablet (81 mg total) by mouth 2 (two) times daily. (Patient not taking: Reported on 04/03/2024) 30 tablet 0 Not Taking   clobetasol  ointment (TEMOVATE ) 0.05 % Apply 1 application topically 2 (two) times daily. Apply as directed twice daily (  Patient not taking: Reported on 11/27/2023) 60 g 1    oxyCODONE  (OXY IR/ROXICODONE ) 5 MG immediate release tablet Take 1-2 tablets (5-10 mg total) by mouth every 6 (six) hours as needed for moderate pain (pain score 4-6) (pain score 4-6). No more than 6 tablets per day (Patient not taking: Reported on 04/03/2024) 30 tablet 0 Not Taking   tiZANidine  (ZANAFLEX ) 4 MG tablet Take 1 tablet (4 mg total) by mouth every 6 (six) hours as needed for muscle spasms.  (Patient not taking: Reported on 04/03/2024) 30 tablet 0 Not Taking   valACYclovir  (VALTREX ) 500 MG tablet TAKE ONE TABLET BY MOUTH TWICE DAILY FOR 3 DAYS STARTING WITH ONSET OF SYMPTOMS (Patient not taking: Reported on 11/27/2023) 30 tablet 1    Allergies  Allergen Reactions   Erythromycin    Vicodin [Hydrocodone-Acetaminophen ]     Faint, hot and sweaty    Social History   Tobacco Use   Smoking status: Never   Smokeless tobacco: Never  Substance Use Topics   Alcohol use: No    Family History  Problem Relation Age of Onset   Osteoporosis Mother    Lung cancer Mother        non small cell    Deep vein thrombosis Mother    Cervical cancer Maternal Grandmother    Heart attack Paternal Grandfather    Diabetes Maternal Grandfather    Breast cancer Sister 53   Thyroid  disease Sister    Thyroid  cancer Other        maternal cousin     Review of Systems  Objective:  Physical Exam Vitals reviewed.  Constitutional:      Appearance: Normal appearance. She is obese.  HENT:     Head: Normocephalic and atraumatic.  Eyes:     Extraocular Movements: Extraocular movements intact.     Pupils: Pupils are equal, round, and reactive to light.  Cardiovascular:     Rate and Rhythm: Normal rate and regular rhythm.  Pulmonary:     Effort: Pulmonary effort is normal.     Breath sounds: Normal breath sounds.  Abdominal:     Palpations: Abdomen is soft.  Musculoskeletal:     Cervical back: Normal range of motion and neck supple.     Right knee: Effusion, bony tenderness and crepitus present. Decreased range of motion. Tenderness present over the medial joint line and lateral joint line. Abnormal alignment and abnormal meniscus.  Neurological:     Mental Status: She is alert and oriented to person, place, and time.  Psychiatric:        Behavior: Behavior normal.     Vital signs in last 24 hours: Temp:  [97.9 F (36.6 C)] 97.9 F (36.6 C) (12/08 1330) Pulse Rate:  [62] 62 (12/08  1330) Resp:  [18] 18 (12/08 1330) BP: (140)/(67) 140/67 (12/08 1330) SpO2:  [100 %] 100 % (12/08 1330) Weight:  [103.7 kg] 103.7 kg (12/08 1330)  Labs:   Estimated body mass index is 38.06 kg/m as calculated from the following:   Height as of 04/08/24: 5' 5 (1.651 m).   Weight as of 04/08/24: 103.7 kg.   Imaging Review Plain radiographs demonstrate severe degenerative joint disease of the right knee(s). The overall alignment ismild varus. The bone quality appears to be excellent for age and reported activity level.      Assessment/Plan:  End stage arthritis, right knee   The patient history, physical examination, clinical judgment of the provider and imaging studies are consistent with  end stage degenerative joint disease of the right knee(s) and total knee arthroplasty is deemed medically necessary. The treatment options including medical management, injection therapy arthroscopy and arthroplasty were discussed at length. The risks and benefits of total knee arthroplasty were presented and reviewed. The risks due to aseptic loosening, infection, stiffness, patella tracking problems, thromboembolic complications and other imponderables were discussed. The patient acknowledged the explanation, agreed to proceed with the plan and consent was signed. Patient is being admitted for inpatient treatment for surgery, pain control, PT, OT, prophylactic antibiotics, VTE prophylaxis, progressive ambulation and ADL's and discharge planning. The patient is planning to be discharged home with home health services

## 2024-04-09 ENCOUNTER — Observation Stay (HOSPITAL_COMMUNITY)

## 2024-04-09 ENCOUNTER — Encounter (HOSPITAL_COMMUNITY): Payer: Self-pay | Admitting: Orthopaedic Surgery

## 2024-04-09 ENCOUNTER — Ambulatory Visit (HOSPITAL_COMMUNITY): Admitting: Anesthesiology

## 2024-04-09 ENCOUNTER — Other Ambulatory Visit (HOSPITAL_COMMUNITY): Payer: Self-pay

## 2024-04-09 ENCOUNTER — Observation Stay (HOSPITAL_COMMUNITY)
Admission: RE | Admit: 2024-04-09 | Discharge: 2024-04-10 | Disposition: A | Attending: Orthopaedic Surgery | Admitting: Orthopaedic Surgery

## 2024-04-09 ENCOUNTER — Other Ambulatory Visit: Payer: Self-pay

## 2024-04-09 ENCOUNTER — Telehealth (HOSPITAL_COMMUNITY): Payer: Self-pay

## 2024-04-09 ENCOUNTER — Encounter (HOSPITAL_COMMUNITY): Admission: RE | Disposition: A | Payer: Self-pay | Source: Home / Self Care | Attending: Orthopaedic Surgery

## 2024-04-09 DIAGNOSIS — M1711 Unilateral primary osteoarthritis, right knee: Secondary | ICD-10-CM | POA: Diagnosis present

## 2024-04-09 DIAGNOSIS — Z96651 Presence of right artificial knee joint: Secondary | ICD-10-CM

## 2024-04-09 HISTORY — PX: TOTAL KNEE ARTHROPLASTY: SHX125

## 2024-04-09 SURGERY — ARTHROPLASTY, KNEE, TOTAL
Anesthesia: Spinal | Site: Knee | Laterality: Right

## 2024-04-09 MED ORDER — OXYCODONE HCL 5 MG PO TABS
10.0000 mg | ORAL_TABLET | ORAL | Status: DC | PRN
  Administered 2024-04-10: 10 mg via ORAL
  Filled 2024-04-09 (×3): qty 2

## 2024-04-09 MED ORDER — FENTANYL CITRATE (PF) 100 MCG/2ML IJ SOLN
25.0000 ug | INTRAMUSCULAR | Status: DC | PRN
  Administered 2024-04-09 (×3): 50 ug via INTRAVENOUS

## 2024-04-09 MED ORDER — LIDOCAINE 2% (20 MG/ML) 5 ML SYRINGE
INTRAMUSCULAR | Status: DC | PRN
  Administered 2024-04-09: 40 mg via INTRAVENOUS

## 2024-04-09 MED ORDER — ACETAMINOPHEN 10 MG/ML IV SOLN
INTRAVENOUS | Status: AC
  Filled 2024-04-09: qty 100

## 2024-04-09 MED ORDER — SODIUM CHLORIDE 0.9 % IR SOLN
Status: DC | PRN
  Administered 2024-04-09: 1000 mL

## 2024-04-09 MED ORDER — DOCUSATE SODIUM 100 MG PO CAPS
100.0000 mg | ORAL_CAPSULE | Freq: Two times a day (BID) | ORAL | Status: DC
  Administered 2024-04-09 – 2024-04-10 (×2): 100 mg via ORAL
  Filled 2024-04-09 (×2): qty 1

## 2024-04-09 MED ORDER — LIDOCAINE 2% (20 MG/ML) 5 ML SYRINGE
INTRAMUSCULAR | Status: AC
  Filled 2024-04-09: qty 5

## 2024-04-09 MED ORDER — FENTANYL CITRATE (PF) 100 MCG/2ML IJ SOLN
INTRAMUSCULAR | Status: AC
  Filled 2024-04-09: qty 2

## 2024-04-09 MED ORDER — LISDEXAMFETAMINE DIMESYLATE 30 MG PO CAPS
30.0000 mg | ORAL_CAPSULE | Freq: Every morning | ORAL | Status: DC
  Administered 2024-04-10: 30 mg via ORAL
  Filled 2024-04-09: qty 1

## 2024-04-09 MED ORDER — OXYCODONE HCL 5 MG PO TABS
5.0000 mg | ORAL_TABLET | Freq: Once | ORAL | Status: AC | PRN
  Administered 2024-04-09: 5 mg via ORAL

## 2024-04-09 MED ORDER — LACTATED RINGERS IV SOLN: INTRAVENOUS | Status: DC

## 2024-04-09 MED ORDER — PHENOL 1.4 % MT LIQD: 1.0000 | OROMUCOSAL | Status: DC | PRN

## 2024-04-09 MED ORDER — POVIDONE-IODINE 10 % EX SWAB
2.0000 | Freq: Once | CUTANEOUS | Status: AC
  Administered 2024-04-09: 2 via TOPICAL

## 2024-04-09 MED ORDER — LEVOTHYROXINE SODIUM 100 MCG PO TABS
200.0000 ug | ORAL_TABLET | Freq: Every day | ORAL | Status: DC
  Administered 2024-04-10: 200 ug via ORAL
  Filled 2024-04-09: qty 3

## 2024-04-09 MED ORDER — BUPIVACAINE-EPINEPHRINE (PF) 0.25% -1:200000 IJ SOLN
INTRAMUSCULAR | Status: DC | PRN
  Administered 2024-04-09: 30 mL

## 2024-04-09 MED ORDER — PROPOFOL 10 MG/ML IV BOLUS
INTRAVENOUS | Status: AC
  Filled 2024-04-09: qty 20

## 2024-04-09 MED ORDER — HYDROMORPHONE HCL 1 MG/ML IJ SOLN
0.2500 mg | INTRAMUSCULAR | Status: DC | PRN
  Administered 2024-04-09 (×4): 0.5 mg via INTRAVENOUS

## 2024-04-09 MED ORDER — PROPOFOL 1000 MG/100ML IV EMUL
INTRAVENOUS | Status: AC
  Filled 2024-04-09: qty 200

## 2024-04-09 MED ORDER — ACETAMINOPHEN 10 MG/ML IV SOLN
1000.0000 mg | Freq: Once | INTRAVENOUS | Status: DC | PRN
  Administered 2024-04-09: 1000 mg via INTRAVENOUS

## 2024-04-09 MED ORDER — ONDANSETRON HCL 4 MG/2ML IJ SOLN: 4.0000 mg | Freq: Four times a day (QID) | INTRAMUSCULAR | Status: DC | PRN

## 2024-04-09 MED ORDER — ATORVASTATIN CALCIUM 10 MG PO TABS
20.0000 mg | ORAL_TABLET | Freq: Every day | ORAL | Status: DC
  Administered 2024-04-09: 20 mg via ORAL
  Filled 2024-04-09 (×2): qty 2

## 2024-04-09 MED ORDER — ACETAMINOPHEN ER 650 MG PO TBCR: 1300.0000 mg | EXTENDED_RELEASE_TABLET | Freq: Three times a day (TID) | ORAL | Status: DC | PRN

## 2024-04-09 MED ORDER — ONDANSETRON HCL 4 MG PO TABS: 4.0000 mg | ORAL_TABLET | Freq: Four times a day (QID) | ORAL | Status: DC | PRN

## 2024-04-09 MED ORDER — ACETAMINOPHEN 325 MG PO TABS
325.0000 mg | ORAL_TABLET | Freq: Four times a day (QID) | ORAL | Status: DC | PRN
  Administered 2024-04-10: 650 mg via ORAL
  Filled 2024-04-09: qty 2

## 2024-04-09 MED ORDER — CEFAZOLIN SODIUM-DEXTROSE 2-4 GM/100ML-% IV SOLN
2.0000 g | INTRAVENOUS | Status: AC
  Administered 2024-04-09: 2 g via INTRAVENOUS
  Filled 2024-04-09: qty 100

## 2024-04-09 MED ORDER — OXYCODONE HCL 5 MG/5ML PO SOLN: 5.0000 mg | Freq: Once | ORAL | Status: AC | PRN

## 2024-04-09 MED ORDER — DEXAMETHASONE SOD PHOSPHATE PF 10 MG/ML IJ SOLN
INTRAMUSCULAR | Status: DC | PRN
  Administered 2024-04-09: 5 mg via INTRAVENOUS

## 2024-04-09 MED ORDER — BIOTIN 1000 MCG PO TABS: 1000.0000 ug | ORAL_TABLET | Freq: Every day | ORAL | Status: DC

## 2024-04-09 MED ORDER — MENTHOL 3 MG MT LOZG: 1.0000 | LOZENGE | OROMUCOSAL | Status: DC | PRN

## 2024-04-09 MED ORDER — APIXABAN 2.5 MG PO TABS
2.5000 mg | ORAL_TABLET | Freq: Two times a day (BID) | ORAL | Status: DC
  Filled 2024-04-09: qty 1

## 2024-04-09 MED ORDER — SUGAMMADEX SODIUM 200 MG/2ML IV SOLN
INTRAVENOUS | Status: DC | PRN
  Administered 2024-04-09: 200 mg via INTRAVENOUS

## 2024-04-09 MED ORDER — ALUM & MAG HYDROXIDE-SIMETH 200-200-20 MG/5ML PO SUSP: 30.0000 mL | ORAL | Status: DC | PRN

## 2024-04-09 MED ORDER — SODIUM CHLORIDE 0.9 % IV SOLN: INTRAVENOUS | Status: AC

## 2024-04-09 MED ORDER — ORAL CARE MOUTH RINSE: 15.0000 mL | Freq: Once | OROMUCOSAL | Status: AC

## 2024-04-09 MED ORDER — TIZANIDINE HCL 4 MG PO TABS
4.0000 mg | ORAL_TABLET | Freq: Four times a day (QID) | ORAL | Status: DC | PRN
  Administered 2024-04-10: 4 mg via ORAL
  Filled 2024-04-09 (×2): qty 1

## 2024-04-09 MED ORDER — FENTANYL CITRATE (PF) 100 MCG/2ML IJ SOLN
INTRAMUSCULAR | Status: DC | PRN
  Administered 2024-04-09 (×3): 50 ug via INTRAVENOUS
  Administered 2024-04-09: 75 ug via INTRAVENOUS
  Administered 2024-04-09: 25 ug via INTRAVENOUS

## 2024-04-09 MED ORDER — FENTANYL CITRATE (PF) 250 MCG/5ML IJ SOLN
INTRAMUSCULAR | Status: AC
  Filled 2024-04-09: qty 5

## 2024-04-09 MED ORDER — PANTOPRAZOLE SODIUM 40 MG PO TBEC
40.0000 mg | DELAYED_RELEASE_TABLET | Freq: Every day | ORAL | Status: DC
  Administered 2024-04-10: 40 mg via ORAL
  Filled 2024-04-09: qty 1

## 2024-04-09 MED ORDER — FERROUS SULFATE 325 (65 FE) MG PO TABS
325.0000 mg | ORAL_TABLET | Freq: Every day | ORAL | Status: DC
  Administered 2024-04-10: 325 mg via ORAL
  Filled 2024-04-09: qty 1

## 2024-04-09 MED ORDER — HYDROMORPHONE HCL 1 MG/ML IJ SOLN: 0.5000 mg | INTRAMUSCULAR | Status: DC | PRN

## 2024-04-09 MED ORDER — OXYCODONE HCL 5 MG PO TABS
5.0000 mg | ORAL_TABLET | ORAL | Status: DC | PRN
  Administered 2024-04-09 (×2): 5 mg via ORAL
  Administered 2024-04-10 (×2): 10 mg via ORAL
  Filled 2024-04-09: qty 2
  Filled 2024-04-09: qty 1

## 2024-04-09 MED ORDER — CHLORHEXIDINE GLUCONATE 0.12 % MT SOLN
15.0000 mL | Freq: Once | OROMUCOSAL | Status: AC
  Administered 2024-04-09: 15 mL via OROMUCOSAL
  Filled 2024-04-09: qty 15

## 2024-04-09 MED ORDER — METOCLOPRAMIDE HCL 5 MG PO TABS: 5.0000 mg | ORAL_TABLET | Freq: Three times a day (TID) | ORAL | Status: DC | PRN

## 2024-04-09 MED ORDER — BUPIVACAINE-EPINEPHRINE (PF) 0.5% -1:200000 IJ SOLN
INTRAMUSCULAR | Status: DC | PRN
  Administered 2024-04-09: 20 mL via PERINEURAL

## 2024-04-09 MED ORDER — HYDROMORPHONE HCL 1 MG/ML IJ SOLN
INTRAMUSCULAR | Status: AC
  Filled 2024-04-09: qty 1

## 2024-04-09 MED ORDER — METOCLOPRAMIDE HCL 5 MG/ML IJ SOLN: 5.0000 mg | Freq: Three times a day (TID) | INTRAMUSCULAR | Status: DC | PRN

## 2024-04-09 MED ORDER — OXYCODONE HCL 5 MG PO TABS
ORAL_TABLET | ORAL | Status: AC
  Filled 2024-04-09: qty 1

## 2024-04-09 MED ORDER — MIDAZOLAM HCL (PF) 2 MG/2ML IJ SOLN
INTRAMUSCULAR | Status: DC | PRN
  Administered 2024-04-09 (×2): 1 mg via INTRAVENOUS

## 2024-04-09 MED ORDER — KETOROLAC TROMETHAMINE 15 MG/ML IJ SOLN
INTRAMUSCULAR | Status: DC | PRN
  Administered 2024-04-09: 15 mg via INTRAVENOUS

## 2024-04-09 MED ORDER — KETOROLAC TROMETHAMINE 30 MG/ML IJ SOLN
INTRAMUSCULAR | Status: AC
  Filled 2024-04-09: qty 1

## 2024-04-09 MED ORDER — ONDANSETRON HCL 4 MG/2ML IJ SOLN
INTRAMUSCULAR | Status: DC | PRN
  Administered 2024-04-09: 4 mg via INTRAVENOUS

## 2024-04-09 MED ORDER — MIDAZOLAM HCL 2 MG/2ML IJ SOLN
INTRAMUSCULAR | Status: AC
  Filled 2024-04-09: qty 2

## 2024-04-09 MED ORDER — ROCURONIUM BROMIDE 100 MG/10ML IV SOLN
INTRAVENOUS | Status: DC | PRN
  Administered 2024-04-09: 40 mg via INTRAVENOUS

## 2024-04-09 MED ORDER — BUPIVACAINE-EPINEPHRINE (PF) 0.25% -1:200000 IJ SOLN
INTRAMUSCULAR | Status: AC
  Filled 2024-04-09: qty 30

## 2024-04-09 MED ORDER — 0.9 % SODIUM CHLORIDE (POUR BTL) OPTIME
TOPICAL | Status: DC | PRN
  Administered 2024-04-09: 1000 mL

## 2024-04-09 MED ORDER — PROPOFOL 10 MG/ML IV BOLUS
INTRAVENOUS | Status: DC | PRN
  Administered 2024-04-09: 50 mg via INTRAVENOUS
  Administered 2024-04-09: 150 mg via INTRAVENOUS

## 2024-04-09 MED ORDER — SENNOSIDES-DOCUSATE SODIUM 8.6-50 MG PO TABS
1.0000 | ORAL_TABLET | Freq: Two times a day (BID) | ORAL | Status: DC
  Administered 2024-04-09 – 2024-04-10 (×2): 1 via ORAL
  Filled 2024-04-09 (×2): qty 1

## 2024-04-09 MED ORDER — DEXAMETHASONE SOD PHOSPHATE PF 10 MG/ML IJ SOLN
10.0000 mg | Freq: Once | INTRAMUSCULAR | Status: AC
  Administered 2024-04-10: 10 mg via INTRAVENOUS

## 2024-04-09 MED ORDER — TRANEXAMIC ACID-NACL 1000-0.7 MG/100ML-% IV SOLN
1000.0000 mg | INTRAVENOUS | Status: AC
  Administered 2024-04-09: 1000 mg via INTRAVENOUS
  Filled 2024-04-09: qty 100

## 2024-04-09 MED ORDER — AMISULPRIDE (ANTIEMETIC) 5 MG/2ML IV SOLN: 10.0000 mg | Freq: Once | INTRAVENOUS | Status: DC | PRN

## 2024-04-09 MED ORDER — DIPHENHYDRAMINE HCL 12.5 MG/5ML PO ELIX: 12.5000 mg | ORAL_SOLUTION | ORAL | Status: DC | PRN

## 2024-04-09 MED ORDER — ONDANSETRON HCL 4 MG/2ML IJ SOLN
INTRAMUSCULAR | Status: AC
  Filled 2024-04-09: qty 2

## 2024-04-09 SURGICAL SUPPLY — 63 items
BAG COUNTER SPONGE SURGICOUNT (BAG) ×1 IMPLANT
BLADE SAG 18X100X1.27 (BLADE) ×1 IMPLANT
BNDG COMPR ESMARK 6X3 LF (GAUZE/BANDAGES/DRESSINGS) ×1 IMPLANT
BNDG ELASTIC 6INX 5YD STR LF (GAUZE/BANDAGES/DRESSINGS) ×2 IMPLANT
BNDG ELASTIC 6X10 VLCR STRL LF (GAUZE/BANDAGES/DRESSINGS) IMPLANT
BOWL SMART MIX CTS (DISPOSABLE) IMPLANT
CEMENT BONE R 1X40 (Cement) IMPLANT
COMPONENT FEM CMT PRSONA SZ7RT (Joint) IMPLANT
COMPONET TIB PS KNEE E 0D RT (Joint) IMPLANT
COOLER ICEMAN CLASSIC (MISCELLANEOUS) ×1 IMPLANT
COVER SURGICAL LIGHT HANDLE (MISCELLANEOUS) ×1 IMPLANT
CUFF TOURN SGL QUICK 42 (TOURNIQUET CUFF) IMPLANT
CUFF TRNQT CYL 34X4.125X (TOURNIQUET CUFF) ×1 IMPLANT
DRAPE EXTREMITY T 121X128X90 (DISPOSABLE) ×1 IMPLANT
DRAPE HALF SHEET 40X57 (DRAPES) ×1 IMPLANT
DRAPE U-SHAPE 47X51 STRL (DRAPES) ×1 IMPLANT
DURAPREP 26ML APPLICATOR (WOUND CARE) ×1 IMPLANT
ELECT CAUTERY BLADE 6.4 (BLADE) ×1 IMPLANT
ELECTRODE BLDE 4.0 EZ CLN MEGD (MISCELLANEOUS) IMPLANT
ELECTRODE REM PT RTRN 9FT ADLT (ELECTROSURGICAL) ×1 IMPLANT
FACESHIELD WRAPAROUND OR TEAM (MASK) ×2 IMPLANT
GAUZE PAD ABD 8X10 STRL (GAUZE/BANDAGES/DRESSINGS) ×1 IMPLANT
GAUZE SPONGE 4X4 12PLY STRL (GAUZE/BANDAGES/DRESSINGS) ×1 IMPLANT
GAUZE SPONGE 4X4 12PLY STRL LF (GAUZE/BANDAGES/DRESSINGS) IMPLANT
GAUZE XEROFORM 1X8 LF (GAUZE/BANDAGES/DRESSINGS) ×1 IMPLANT
GLOVE BIOGEL PI IND STRL 8 (GLOVE) ×2 IMPLANT
GLOVE PI ORTHO PRO STRL 7.5 (GLOVE) ×1 IMPLANT
GLOVE PI ORTHO PRO STRL SZ8 (GLOVE) ×1 IMPLANT
GOWN STRL REUS W/ TWL LRG LVL3 (GOWN DISPOSABLE) IMPLANT
GOWN STRL REUS W/ TWL XL LVL3 (GOWN DISPOSABLE) ×2 IMPLANT
GUIDE TRIAL TIB UNI KNEE RU (ORTHOPEDIC DISPOSABLE SUPPLIES) IMPLANT
IMMOBILIZER KNEE 22 UNIV (SOFTGOODS) ×1 IMPLANT
INSERT TIB AS PERS SZ 6-7 14 (Insert) IMPLANT
IV 0.9% NACL 1000 ML (IV SOLUTION) ×1 IMPLANT
KIT BASIN OR (CUSTOM PROCEDURE TRAY) ×1 IMPLANT
KIT TURNOVER KIT B (KITS) ×1 IMPLANT
MANIFOLD NEPTUNE II (INSTRUMENTS) ×1 IMPLANT
NDL 18GX1X1/2 (RX/OR ONLY) (NEEDLE) IMPLANT
PACK TOTAL JOINT (CUSTOM PROCEDURE TRAY) ×1 IMPLANT
PAD ABD 8X10 STRL (GAUZE/BANDAGES/DRESSINGS) IMPLANT
PAD ARMBOARD POSITIONER FOAM (MISCELLANEOUS) ×1 IMPLANT
PAD COLD SHLDR WRAP-ON (PAD) ×1 IMPLANT
PADDING CAST COTTON 6X4 STRL (CAST SUPPLIES) ×1 IMPLANT
PENCIL BUTTON HOLSTER BLD 10FT (ELECTRODE) IMPLANT
PENCIL SMOKE EVACUATOR (MISCELLANEOUS) IMPLANT
PIN DRILL HDLS TROCAR 75 4PK (PIN) IMPLANT
SCREW FEMALE HEX FIX 25X2.5 (ORTHOPEDIC DISPOSABLE SUPPLIES) IMPLANT
SET HNDPC FAN SPRY TIP SCT (DISPOSABLE) ×1 IMPLANT
SET PAD KNEE POSITIONER (MISCELLANEOUS) ×1 IMPLANT
SOLN 0.9% NACL POUR BTL 1000ML (IV SOLUTION) ×1 IMPLANT
STAPLER SKIN PROX 35W (STAPLE) ×1 IMPLANT
STEM POLY PAT PLY 29M KNEE (Knees) IMPLANT
SUCTION TUBE FRAZIER 10FR DISP (SUCTIONS) ×1 IMPLANT
SUT VIC AB 0 CT1 27XBRD ANBCTR (SUTURE) ×1 IMPLANT
SUT VIC AB 0 CT1 36 (SUTURE) IMPLANT
SUT VIC AB 1 CT1 27XBRD ANBCTR (SUTURE) ×2 IMPLANT
SUT VIC AB 1 CTX36XBRD ANBCTR (SUTURE) IMPLANT
SUT VIC AB 2-0 CT1 TAPERPNT 27 (SUTURE) ×2 IMPLANT
SYR 30ML LL (SYRINGE) IMPLANT
SYR 50ML LL SCALE MARK (SYRINGE) IMPLANT
SYR CONTROL 10ML LL (SYRINGE) ×1 IMPLANT
TOWEL GREEN STERILE (TOWEL DISPOSABLE) ×1 IMPLANT
TOWEL GREEN STERILE FF (TOWEL DISPOSABLE) ×1 IMPLANT

## 2024-04-09 NOTE — Op Note (Signed)
 Operative Note  Date of operation: 04/09/2024 Preoperative diagnosis: Right knee primary osteoarthritis Postoperative diagnosis: Same  Procedure: Right cemented total knee arthroplasty  Implants: Biomet/Zimmer persona cemented knee system (CR medial congruent) Implant Name Type Inv. Item Serial No. Manufacturer Lot No. LRB No. Used Action  CEMENT BONE R 1X40 - [id number] Cement CEMENT BONE R 1X40  ZIMMER RECON(ORTH,TRAU,BIO,SG) [id number] Right 2 Implanted  STEM POLY PAT PLY 86M KNEE - [id number] Knees STEM POLY PAT PLY 86M KNEE  ZIMMER RECON(ORTH,TRAU,BIO,SG)  Right 1 Implanted  INSERT TIB AS PERS SZ 6-7 14 - [id number] Insert INSERT TIB AS PERS SZ 6-7 14  ZIMMER RECON(ORTH,TRAU,BIO,SG)  Right 1 Implanted  COMPONENT FEM CMT PRSONA [id number] - [id number] Joint COMPONENT FEM CMT PRSONA [id number]  ZIMMER RECON(ORTH,TRAU,BIO,SG)  Right 1 Implanted  COMPONET TIB PS KNEE E 0D RT - [id number] Joint COMPONET TIB PS KNEE E 0D RT  ZIMMER RECON(ORTH,TRAU,BIO,SG)  Right 1 Implanted   Surgeon: Lonni GRADE. Vernetta, MD Assistant: Tory Gaskins, PA-C  Anesthesia: #1 right lower extremity adductor canal block, #2 General, #3 local Antibiotics: IV Ancef  Tourniquet time: Just over 1 hour EBL: Less than 50 cc Complications: None  Indications: The patient is a 63 year old female with debilitating arthritis involving her right knee has been well-documented with clinical exam findings and x-ray findings.  She has tried and failed conservative treatment for well over a year now.  At this point her right knee pain is daily and it is detrimentally affecting her mobility, her quality of life and her actives of daily living.  She does wish to proceed with a knee replacement at this point.  We agree with this as well but she understands this will be quite difficult.  Her BMI is down to 38 but she has a very large soft tissue envelope around her right knee and a heavy leg does this can be a much more difficult case.  She has  done well with a left anterior hip replacement that was done earlier this year.  We did discuss in length in detail the risks of acute blood loss anemia, nerve and vessel injury, fracture, infection, DVT, implant failure and wound healing issues.  All these are heightened due to her obesity.  She understands that our goals are hopefully decreased pain, improved mobility and improved quality of life.  Procedure description: After informed consent was obtained the appropriate right knee was marked, anesthesia obtained a right lower extremity adductor canal block in the holding room.  The patient was then brought to the operating room and placed supine on the operating table and general anesthesia was obtained.  A nonsterile tractors placed around upper right thigh and right thigh, knee, leg, ankle and foot were prepped and draped with DuraPrep and sterile drapes including a sterile stockinette.  A timeout was called and she was identified as the correct patient and the correct right knee.  An Esmarch was then used to wrap out the leg and the tourniquet was inflated to 300 mm pressure.  With the knee extended a direct medial incision was made over the tail and carried proximally distally.  Dissection was carried down to the knee joint and a medial parapatellar arthrotomy was made finding a moderate joint effusion.  We then flexed the knee and found complete cartilage loss in the medial compartment the knee and patellofemoral joint.  The ACL as well as medial lateral meniscus were removed and osteophytes were removed from all 3 compartments.  We then used an extramedullary  based cutting guide for making her proximal tibia cut correction for varus and valgus and a 5 degree slope.  We made this cut to take 2 mm off the low side and we did backed this down to more millimeters.  We then used the intramedullary cutting guide for distal femur cut going to the notch of the femur setting this for a right knee at 5 degrees  actually rotated and a 10 mm distal femoral cut.  Who brought the knee back down to full extension and achieve full extension with a 10 mm block.  We then impacted the femur and put a femoral sizing guide based off the epicondylar axis and Whitesides line.  Based off this we chose a size 7 femur.  We put a 4-in-1 cutting block for a size 7 femur and made our anterior cut as well as her posterior cut and her chamfer cuts.  We then backed the tibia and chose a size E right tibial tray for coverage over the tibial plateau setting the rotation of the tibial tubercle and the femur.  We did our drill hole and keel punch over this.  We then trialed our size E right tibia combined with our size 7 right CR standard femur.  We then trialed up to a 14 mm thickness right medial congruent path and insert and we are pleased with range of motion and stability without insert.  We then made a patella cut and drilled 3 also a size 29 patella button.  Next all instrumentation was removed from the knee and we irrigated the knee with normal saline solution.  Marcaine  with epinephrine  was then placed around the arthrotomy.  The cement was then mixed in with the knee in a flexed position and dried real well we cemented our Biomet/Zimmer persona tibial tray for right knee size E followed by cementing our size 7 right CR standard femur.  We removed excess cement debris from the knee and placed our 14 mm thickness right medial congruent polythene insert.  Next we cemented our size 29 patella button.  We then held the knee fully extended and compressed while the cemented hardened.  Once the cement hardened the tourniquet was let down and hemostasis was obtained with electrocautery.  The arthrotomy was then closed with interrupted #1 Vicryl suture followed by 0 Vicryl close deep tissue and 2-0 Vicryl close subcutaneous tissue.  The skin was closed with staples.  Well-padded sterile dressing was applied.  The patient was awakened, extubated and  taken to recovery room.  Tory Gaskins, PA-C did assist in the entire case and beginning then his assistance was crucial and medically necessary for soft tissue management and retraction, helping guide implant placement and a layered closure of the wound.

## 2024-04-09 NOTE — Transfer of Care (Signed)
 Immediate Anesthesia Transfer of Care Note  Patient: [name]  Procedure(s) Performed: ARTHROPLASTY, KNEE, TOTAL (Right: Knee)  Patient Location: PACU  Anesthesia Type:General  Level of Consciousness: awake and alert   Airway & Oxygen Therapy: Patient Spontanous Breathing and Patient connected to face mask oxygen  Post-op Assessment: Report given to RN and Patient moving all extremities X 4  Post vital signs: Reviewed  Last Vitals:  Vitals Value Taken Time  BP 127/75 04/09/24 10:00  Temp    Pulse 65 04/09/24 10:01  Resp 12 04/09/24 10:01  SpO2 100 % 04/09/24 10:01  Vitals shown include unfiled device data.  Last Pain:  Vitals:   04/09/24 0627  TempSrc:   PainSc: 0-No pain         Complications: There were no known notable events for this encounter.

## 2024-04-09 NOTE — Anesthesia Procedure Notes (Signed)
 Anesthesia Regional Block: Adductor canal block   Pre-Anesthetic Checklist: , timeout performed,  Correct Patient, Correct Site, Correct Laterality,  Correct Procedure, Correct Position, site marked,  Risks and benefits discussed,  Pre-op evaluation,  At surgeon's request and post-op pain management  Laterality: Right  Prep: Maximum Sterile Barrier Precautions used, chloraprep       Needles:  Injection technique: Single-shot  Needle Type: Echogenic Stimulator Needle     Needle Length: 9cm  Needle Gauge: 21     Additional Needles:   Procedures:,,,, ultrasound used (permanent image in chart),,    Narrative:  Start time: 04/09/2024 7:09 AM End time: 04/09/2024 7:19 AM Injection made incrementally with aspirations every 5 mL.  Performed by: Personally  Anesthesiologist: Epifanio Fallow, MD

## 2024-04-09 NOTE — Anesthesia Procedure Notes (Signed)
 Procedure Name: Intubation Date/Time: 04/09/2024 7:41 AM  Performed by: Jerrye Herring, CRNAPre-anesthesia Checklist: Patient identified, Emergency Drugs available, Suction available and Patient being monitored Patient Re-evaluated:Patient Re-evaluated prior to induction Oxygen Delivery Method: Circle System Utilized Preoxygenation: Pre-oxygenation with 100% oxygen Induction Type: IV induction Ventilation: Mask ventilation without difficulty Laryngoscope Size: Mac and 3 Grade View: Grade I Tube type: Oral Number of attempts: 1 Airway Equipment and Method: Stylet and Oral airway Placement Confirmation: ETT inserted through vocal cords under direct vision, positive ETCO2 and breath sounds checked- equal and bilateral Secured at: 23 cm Tube secured with: Tape Dental Injury: Teeth and Oropharynx as per pre-operative assessment

## 2024-04-09 NOTE — Interval H&P Note (Signed)
 History and Physical Interval Note: The patient understands that she is here today for a right total knee replacement to treat her significant right knee pain and arthritis.  There has been no acute or interval change in her medical status.  The risks and benefits of surgery have been discussed in detail and informed consent has been obtained.  The right operative knee has been marked.  04/09/2024 7:02 AM  [name]  has presented today for surgery, with the diagnosis of Osteoarthritis Right Knee.  The various methods of treatment have been discussed with the patient and family. After consideration of risks, benefits and other options for treatment, the patient has consented to  Procedure(s): ARTHROPLASTY, KNEE, TOTAL (Right) as a surgical intervention.  The patient's history has been reviewed, patient examined, no change in status, stable for surgery.  I have reviewed the patient's chart and labs.  Questions were answered to the patient's satisfaction.     [name]

## 2024-04-09 NOTE — Telephone Encounter (Signed)
 Pharmacy Patient Advocate Encounter  Insurance verification completed.    The patient is insured through [facility]. Patient has [employer], may use a copay card, and/or apply for patient assistance if available.    Ran test claim for Eliquis  5mg  tablet and the current 30 day co-pay is $0.   This test claim was processed through [employer]- copay amounts may vary at other pharmacies due to [employer], or as the patient moves through the different stages of their insurance plan.

## 2024-04-09 NOTE — Evaluation (Addendum)
 Physical Therapy Evaluation Patient Details Name: [name] MRN: [id number] DOB: [birth year] Today's Date: 04/09/2024  History of Present Illness  63 y.o. female presents to [facility] hospital on 04/09/2024 for elective R TKA. PMH includes L THA (12/2023), hypothyroidism, lichen sclerosus, HSV, OA.  Clinical Impression  Pt admitted s/p R TKA. Pt pleasant and agreeable to participate in physical therapy evaluation. PTA, pt lives with her spouse in a house with 4 steps to enter and works from home. Pt presents with expected post op deficits including decreased R knee ROM, strength, and pain. Pt able to take a couple steps with RW with CGA and verbal/visual instruction for sequencing and technique. Pt reports dizziness/feeling hot and associated pallor and chair brought up behind her. Reclined pt with cool washcloth and reapplied ice on knee; pt reports feeling much better. Suspect she will do well tomorrow. Will follow up to further review HEP, gait training and stair training prior to d/c.        If plan is discharge home, recommend the following: A little help with walking and/or transfers;A little help with bathing/dressing/bathroom;Assistance with cooking/housework;Assist for transportation;Help with stairs or ramp for entrance   Can travel by private vehicle        Equipment Recommendations BSC/3in1 (bariatric)  Recommendations for Other Services       Functional Status Assessment Patient has had a recent decline in their functional status and demonstrates the ability to make significant improvements in function in a reasonable and predictable amount of time.     Precautions / Restrictions Precautions Precautions: Fall Restrictions Weight Bearing Restrictions Per Provider Order: No      Mobility  Bed Mobility Overal bed mobility: Needs Assistance Bed Mobility: Supine to Sit     Supine to sit: Supervision          Transfers Overall transfer level: Needs  assistance Equipment used: Rolling walker (2 wheels) Transfers: Sit to/from Stand Sit to Stand: Contact guard assist           General transfer comment: Verbal cues for hand placement    Ambulation/Gait Ambulation/Gait assistance: Contact guard assist Gait Distance (Feet): 3 Feet Assistive device: Rolling walker (2 wheels) Gait Pattern/deviations: Step-to pattern, Decreased dorsiflexion - left, Decreased weight shift to left Gait velocity: decreased Gait velocity interpretation: <1.31 ft/sec, indicative of household ambulator   General Gait Details: Verbal and visual cues for sequencing/technique, walker use. Decreased R heel strike at initial contact. Heavy use of arms on RW  Stairs            Wheelchair Mobility     Tilt Bed    Modified Rankin (Stroke Patients Only)       Balance Overall balance assessment: Needs assistance Sitting-balance support: Feet supported Sitting balance-Leahy Scale: Good     Standing balance support: Bilateral upper extremity supported Standing balance-Leahy Scale: Poor                               Pertinent Vitals/Pain Pain Assessment Pain Assessment: Faces Faces Pain Scale: Hurts little more Pain Location: thigh Pain Descriptors / Indicators: Operative site guarding, Grimacing Pain Intervention(s): Monitored during session, Premedicated before session, Ice applied    Home Living Family/patient expects to be discharged to:: Private residence Living Arrangements: Spouse/significant other Available Help at Discharge: Family;Available 24 hours/day Type of Home: House Home Access: Stairs to enter Entrance Stairs-Rails: Left Entrance Stairs-Number of Steps: 4   Home Layout: Multi-level;Able  to live on main level with bedroom/bathroom Home Equipment: Rolling Walker (2 wheels);Rollator (4 wheels);Standard Walker;Crutches      Prior Function Prior Level of Function : Independent/Modified  Independent;Working/employed             Mobility Comments: works for [employer]       Extremity/Trunk Assessment   Upper Extremity Assessment Upper Extremity Assessment: Overall WFL for tasks assessed    Lower Extremity Assessment Lower Extremity Assessment: RLE deficits/detail RLE Deficits / Details: s/p TKA. Ankle DF 2/5, able to perform quad set, unable to perform SLR    Cervical / Trunk Assessment Cervical / Trunk Assessment: Other exceptions Cervical / Trunk Exceptions: increased body habitus  Communication   Communication Communication: No apparent difficulties    Cognition Arousal: Alert Behavior During Therapy: WFL for tasks assessed/performed   PT - Cognitive impairments: No apparent impairments                         Following commands: Intact       Cueing Cueing Techniques: Verbal cues     General Comments      Exercises Total Joint Exercises Ankle Circles/Pumps: Both, 10 reps, Supine, AROM Quad Sets: AROM, Both, 10 reps, Supine   Assessment/Plan    PT Assessment Patient needs continued PT services  PT Problem List Decreased strength;Decreased range of motion;Decreased activity tolerance;Decreased balance;Decreased mobility;Pain;Obesity       PT Treatment Interventions DME instruction;Gait training;Stair training;Functional mobility training;Therapeutic activities;Therapeutic exercise;Balance training;Patient/family education    PT Goals (Current goals can be found in the Care Plan section)  Acute Rehab PT Goals Patient Stated Goal: to do everything right PT Goal Formulation: With patient Time For Goal Achievement: 04/23/24 Potential to Achieve Goals: Good    Frequency 7X/week     Co-evaluation               AM-PAC PT 6 Clicks Mobility  Outcome Measure Help needed turning from your back to your side while in a flat bed without using bedrails?: A Little Help needed moving from lying on your back to sitting on the side  of a flat bed without using bedrails?: A Little Help needed moving to and from a bed to a chair (including a wheelchair)?: A Little Help needed standing up from a chair using your arms (e.g., wheelchair or bedside chair)?: A Little Help needed to walk in hospital room?: A Lot Help needed climbing 3-5 steps with a railing? : A Lot 6 Click Score: 16    End of Session Equipment Utilized During Treatment: Gait belt Activity Tolerance: Patient tolerated treatment well Patient left: in chair;with call bell/phone within reach Nurse Communication: Mobility status PT Visit Diagnosis: Other abnormalities of gait and mobility (R26.89);Difficulty in walking, not elsewhere classified (R26.2);Pain Pain - Right/Left: Right Pain - part of body: Knee    Time: 1530-1555 PT Time Calculation (min) (ACUTE ONLY): 25 min   Charges:   PT Evaluation $PT Eval Low Complexity: 1 Low PT Treatments $Therapeutic Activity: 8-22 mins PT General Charges $$ ACUTE PT VISIT: 1 Visit         Aleck Daring, PT, DPT Acute Rehabilitation Services Office [phone]   Aleck ONEIDA Daring 04/09/2024, 4:05 PM

## 2024-04-09 NOTE — Anesthesia Postprocedure Evaluation (Signed)
 Anesthesia Post Note  Patient: [name]  Procedure(s) Performed: ARTHROPLASTY, KNEE, TOTAL (Right: Knee)     Patient location during evaluation: PACU Anesthesia Type: Regional and General Level of consciousness: awake Pain management: pain level controlled Vital Signs Assessment: post-procedure vital signs reviewed and stable Respiratory status: spontaneous breathing, nonlabored ventilation and respiratory function stable Cardiovascular status: blood pressure returned to baseline and stable Postop Assessment: no apparent nausea or vomiting Anesthetic complications: no   There were no known notable events for this encounter.  Last Vitals:  Vitals:   04/09/24 1319 04/09/24 1720  BP: (!) 164/94 (!) 106/59  Pulse: 81 67  Resp: 18 19  Temp: 37.4 C 37 C  SpO2: 100% 100%    Last Pain:  Vitals:   04/09/24 1720  TempSrc: Oral  PainSc:                  Bernardino SQUIBB Faithlynn Deeley

## 2024-04-09 NOTE — Progress Notes (Signed)
    Durable Medical Equipment  (From admission, onward)           Start     Ordered   04/09/24 1551  For home use only DME Bedside commode  Once       Comments: Pt requires a bariatric size due to body habitus  Question:  Patient needs a bedside commode to treat with the following condition  Answer:  Weakness   04/09/24 1550   04/09/24 1311  DME 3 n 1  Once       Comments: Bariatric size   04/09/24 1310   04/09/24 1311  DME Walker rolling  Once       Question Answer Comment  Walker: With 5 Inch Wheels   Patient needs a walker to treat with the following condition Status post total right knee replacement      04/09/24 1310            BSC: Pt requires a bariatric size bedside commode due to her body habitus. She can not safely use a regular sized BSC.

## 2024-04-09 NOTE — Anesthesia Preprocedure Evaluation (Addendum)
 Anesthesia Evaluation  Patient identified by MRN, date of birth, ID band Patient awake    Reviewed: Allergy & Precautions, NPO status , Patient's Chart, lab work & pertinent test results  Airway Mallampati: II  TM Distance: >3 FB Neck ROM: Full    Dental no notable dental hx.    Pulmonary neg pulmonary ROS   Pulmonary exam normal        Cardiovascular negative cardio ROS Normal cardiovascular exam     Neuro/Psych negative neurological ROS  negative psych ROS   GI/Hepatic negative GI ROS, Neg liver ROS,,,  Endo/Other  Hypothyroidism    Renal/GU negative Renal ROS     Musculoskeletal  (+) Arthritis ,    Abdominal  (+) + obese  Peds  Hematology negative hematology ROS (+)   Anesthesia Other Findings Osteoarthritis Right Knee  Reproductive/Obstetrics                              Anesthesia Physical Anesthesia Plan  ASA: 2  Anesthesia Plan: General and Regional   Post-op Pain Management: Regional block*   Induction: Intravenous  PONV Risk Score and Plan: 3 and Ondansetron , Dexamethasone , Midazolam  and Treatment may vary due to age or medical condition  Airway Management Planned: LMA  Additional Equipment:   Intra-op Plan:   Post-operative Plan: Extubation in OR  Informed Consent: I have reviewed the patients History and Physical, chart, labs and discussed the procedure including the risks, benefits and alternatives for the proposed anesthesia with the patient or authorized representative who has indicated his/her understanding and acceptance.     Dental advisory given  Plan Discussed with: CRNA  Anesthesia Plan Comments:          Anesthesia Quick Evaluation

## 2024-04-09 NOTE — Discharge Instructions (Signed)
 Per [facility] clinic policy, our goal is ensure optimal postoperative pain control with a multimodal pain management strategy. For all [facility] patients, our goal is to wean post-operative narcotic medications by 6 weeks post-operatively. If this is not possible due to utilization of pain medication prior to surgery, your [facility] doctor will support your acute post-operative pain control for the first 6 weeks postoperatively, with a plan to transition you back to your primary pain team following that. [name] will work to ensure a Therapist, occupational.  INSTRUCTIONS AFTER JOINT REPLACEMENT   Remove items at home which could result in a fall. This includes throw rugs or furniture in walking pathways ICE to the affected joint every three hours while awake for 30 minutes at a time, for at least the first 3-5 days, and then as needed for pain and swelling.  Continue to use ice for pain and swelling. You may notice swelling that will progress down to the foot and ankle.  This is normal after surgery.  Elevate your leg when you are not up walking on it.   Continue to use the breathing machine you got in the hospital (incentive spirometer) which will help keep your temperature down.  It is common for your temperature to cycle up and down following surgery, especially at night when you are not up moving around and exerting yourself.  The breathing machine keeps your lungs expanded and your temperature down.   DIET:  As you were doing prior to hospitalization, we recommend a well-balanced diet.  DRESSING / WOUND CARE / SHOWERING  Keep the surgical dressing until follow up.  The dressing is water  proof, so you can shower without any extra covering.  IF THE DRESSING FALLS OFF or the wound gets wet inside, change the dressing with sterile gauze.  Please use good hand washing techniques before changing the dressing.  Do not use any lotions or creams on the incision until instructed by your surgeon.     ACTIVITY  Increase activity slowly as tolerated, but follow the weight bearing instructions below.   No driving for 6 weeks or until further direction given by your physician.  You cannot drive while taking narcotics.  No lifting or carrying greater than 10 lbs. until further directed by your surgeon. Avoid periods of inactivity such as sitting longer than an hour when not asleep. This helps prevent blood clots.  You may return to work once you are authorized by your doctor.     WEIGHT BEARING   Weight bearing as tolerated with assist device (walker, cane, etc) as directed, use it as long as suggested by your surgeon or therapist, typically at least 4-6 weeks.   EXERCISES  Results after joint replacement surgery are often greatly improved when you follow the exercise, range of motion and muscle strengthening exercises prescribed by your doctor. Safety measures are also important to protect the joint from further injury. Any time any of these exercises cause you to have increased pain or swelling, decrease what you are doing until you are comfortable again and then slowly increase them. If you have problems or questions, call your caregiver or physical therapist for advice.   Rehabilitation is important following a joint replacement. After just a few days of immobilization, the muscles of the leg can become weakened and shrink (atrophy).  These exercises are designed to build up the tone and strength of the thigh and leg muscles and to improve motion. Often times heat used for twenty to thirty minutes before  working out will loosen up your tissues and help with improving the range of motion but do not use heat for the first two weeks following surgery (sometimes heat can increase post-operative swelling).   These exercises can be done on a training (exercise) mat, on the floor, on a table or on a bed. Use whatever works the best and is most comfortable for you.    Use music or television  while you are exercising so that the exercises are a pleasant break in your day. This will make your life better with the exercises acting as a break in your routine that you can look forward to.   Perform all exercises about fifteen times, three times per day or as directed.  You should exercise both the operative leg and the other leg as well.  Exercises include:   Quad Sets - Tighten up the muscle on the front of the thigh (Quad) and hold for 5-10 seconds.   Straight Leg Raises - With your knee straight (if you were given a brace, keep it on), lift the leg to 60 degrees, hold for 3 seconds, and slowly lower the leg.  Perform this exercise against resistance later as your leg gets stronger.  Leg Slides: Lying on your back, slowly slide your foot toward your buttocks, bending your knee up off the floor (only go as far as is comfortable). Then slowly slide your foot back down until your leg is flat on the floor again.  Angel Wings: Lying on your back spread your legs to the side as far apart as you can without causing discomfort.  Hamstring Strength:  Lying on your back, push your heel against the floor with your leg straight by tightening up the muscles of your buttocks.  Repeat, but this time bend your knee to a comfortable angle, and push your heel against the floor.  You may put a pillow under the heel to make it more comfortable if necessary.   A rehabilitation program following joint replacement surgery can speed recovery and prevent re-injury in the future due to weakened muscles. Contact your doctor or a physical therapist for more information on knee rehabilitation.    CONSTIPATION  Constipation is defined medically as fewer than three stools per week and severe constipation as less than one stool per week.  Even if you have a regular bowel pattern at home, your normal regimen is likely to be disrupted due to multiple reasons following surgery.  Combination of anesthesia, postoperative  narcotics, change in appetite and fluid intake all can affect your bowels.   YOU MUST use at least one of the following options; they are listed in order of increasing strength to get the job done.  They are all available over the counter, and you may need to use some, POSSIBLY even all of these options:    Drink plenty of fluids (prune juice may be helpful) and high fiber foods Colace 100 mg by mouth twice a day  Senokot for constipation as directed and as needed Dulcolax (bisacodyl), take with full glass of water  Miralax (polyethylene glycol) once or twice a day as needed.  If you have tried all these things and are unable to have a bowel movement in the first 3-4 days after surgery call either your surgeon or your primary doctor.    If you experience loose stools or diarrhea, hold the medications until you stool forms back up.  If your symptoms do not get better within 1 week  or if they get worse, check with your doctor.  If you experience the worst abdominal pain ever or develop nausea or vomiting, please contact the office immediately for further recommendations for treatment.   ITCHING:  If you experience itching with your medications, try taking only a single pain pill, or even half a pain pill at a time.  You can also use Benadryl  over the counter for itching or also to help with sleep.   TED HOSE STOCKINGS:  Use stockings on both legs until for at least 2 weeks or as directed by physician office. They may be removed at night for sleeping.  MEDICATIONS:  See your medication summary on the "After Visit Summary" that nursing will review with you.  You may have some home medications which will be placed on hold until you complete the course of blood thinner medication.  It is important for you to complete the blood thinner medication as prescribed.  PRECAUTIONS:  If you experience chest pain or shortness of breath - call 911 immediately for transfer to the hospital emergency department.    If you develop a fever greater that 101 F, purulent drainage from wound, increased redness or drainage from wound, foul odor from the wound/dressing, or calf pain - CONTACT YOUR SURGEON.                                                   FOLLOW-UP APPOINTMENTS:  If you do not already have a post-op appointment, please call the office for an appointment to be seen by your surgeon.  Guidelines for how soon to be seen are listed in your "After Visit Summary", but are typically between 1-4 weeks after surgery.  OTHER INSTRUCTIONS:   Knee Replacement:  Do not place pillow under knee, focus on keeping the knee straight while resting. CPM instructions: 0-90 degrees, 2 hours in the morning, 2 hours in the afternoon, and 2 hours in the evening. Place foam block, curve side up under heel at all times except when in CPM or when walking.  DO NOT modify, tear, cut, or change the foam block in any way.  POST-OPERATIVE OPIOID TAPER INSTRUCTIONS: It is important to wean off of your opioid medication as soon as possible. If you do not need pain medication after your surgery it is ok to stop day one. Opioids include: Codeine, Hydrocodone(Norco, Vicodin), Oxycodone (Percocet, oxycontin ) and hydromorphone  amongst others.  Long term and even short term use of opiods can cause: Increased pain response Dependence Constipation Depression Respiratory depression And more.  Withdrawal symptoms can include Flu like symptoms Nausea, vomiting And more Techniques to manage these symptoms Hydrate well Eat regular healthy meals Stay active Use relaxation techniques(deep breathing, meditating, yoga) Do Not substitute Alcohol to help with tapering If you have been on opioids for less than two weeks and do not have pain than it is ok to stop all together.  Plan to wean off of opioids This plan should start within one week post op of your joint replacement. Maintain the same interval or time between taking each dose  and first decrease the dose.  Cut the total daily intake of opioids by one tablet each day Next start to increase the time between doses. The last dose that should be eliminated is the evening dose.   MAKE SURE YOU:  Understand these instructions.  Get help right away if you are not doing well or get worse.    Thank you for letting us  be a part of your medical care team.  It is a privilege we respect greatly.  We hope these instructions will help you stay on track for a fast and full recovery!     Dental Antibiotics:  In most cases prophylactic antibiotics for Dental procdeures after total joint surgery are not necessary.  Exceptions are as follows:  1. History of prior total joint infection  2. Severely immunocompromised (Organ Transplant, cancer chemotherapy, Rheumatoid biologic meds such as Humera)  3. Poorly controlled diabetes (A1C &gt; 8.0, blood glucose over 200)  If you have one of these conditions, contact your surgeon for an antibiotic prescription, prior to your dental procedure.

## 2024-04-10 ENCOUNTER — Encounter (HOSPITAL_COMMUNITY): Payer: Self-pay | Admitting: Orthopaedic Surgery

## 2024-04-10 ENCOUNTER — Other Ambulatory Visit (HOSPITAL_COMMUNITY): Payer: Self-pay

## 2024-04-10 DIAGNOSIS — M1711 Unilateral primary osteoarthritis, right knee: Secondary | ICD-10-CM | POA: Diagnosis not present

## 2024-04-10 LAB — CBC
HCT: 33.7 % — ABNORMAL LOW (ref 36.0–46.0)
Hemoglobin: 10.9 g/dL — ABNORMAL LOW (ref 12.0–15.0)
MCH: 27.8 pg (ref 26.0–34.0)
MCHC: 32.3 g/dL (ref 30.0–36.0)
MCV: 86 fL (ref 80.0–100.0)
Platelets: 202 K/uL (ref 150–400)
RBC: 3.92 MIL/uL (ref 3.87–5.11)
RDW: 15.1 % (ref 11.5–15.5)
WBC: 5.8 K/uL (ref 4.0–10.5)
nRBC: 0 % (ref 0.0–0.2)

## 2024-04-10 LAB — BASIC METABOLIC PANEL WITH GFR
Anion gap: 7 (ref 5–15)
BUN: 17 mg/dL (ref 8–23)
CO2: 27 mmol/L (ref 22–32)
Calcium: 8 mg/dL — ABNORMAL LOW (ref 8.9–10.3)
Chloride: 103 mmol/L (ref 98–111)
Creatinine, Ser: 0.64 mg/dL (ref 0.44–1.00)
GFR, Estimated: >60 mL/min (ref >60)
Glucose, Bld: 126 mg/dL — ABNORMAL HIGH (ref 70–99)
Potassium: 3.7 mmol/L (ref 3.5–5.1)
Sodium: 137 mmol/L (ref 135–145)

## 2024-04-10 MED ORDER — TIZANIDINE HCL 4 MG PO TABS
4.0000 mg | ORAL_TABLET | Freq: Four times a day (QID) | ORAL | 0 refills | Status: AC | PRN
  Filled 2024-04-10: qty 30, 8d supply, fill #0

## 2024-04-10 MED ORDER — KETOROLAC TROMETHAMINE 15 MG/ML IJ SOLN
15.0000 mg | Freq: Once | INTRAMUSCULAR | Status: AC
  Administered 2024-04-10: 15 mg via INTRAVENOUS
  Filled 2024-04-10: qty 1

## 2024-04-10 MED ORDER — ASPIRIN 81 MG PO CHEW
81.0000 mg | CHEWABLE_TABLET | Freq: Two times a day (BID) | ORAL | 0 refills | Status: AC
  Filled 2024-04-10: qty 30, 15d supply, fill #0

## 2024-04-10 MED ORDER — ASPIRIN 81 MG PO CHEW
81.0000 mg | CHEWABLE_TABLET | Freq: Two times a day (BID) | ORAL | 0 refills | Status: DC
  Filled 2024-04-10: qty 30, 15d supply, fill #0

## 2024-04-10 MED ORDER — OXYCODONE HCL 5 MG PO TABS
5.0000 mg | ORAL_TABLET | ORAL | 0 refills | Status: DC | PRN
  Filled 2024-04-10: qty 30, 3d supply, fill #0

## 2024-04-10 MED ORDER — ASPIRIN 81 MG PO CHEW
81.0000 mg | CHEWABLE_TABLET | Freq: Two times a day (BID) | ORAL | Status: DC
  Administered 2024-04-10: 81 mg via ORAL
  Filled 2024-04-10: qty 1

## 2024-04-10 NOTE — Progress Notes (Signed)
 Physical Therapy Treatment  Patient Details Name: [name] MRN: [id number] DOB: Dec 12, 1960 Today's Date: 04/10/2024   History of Present Illness Pt is a 63 y/o female who presents to [facility] hospital on 04/09/2024 for elective R TKA. PMH includes L THA (12/2023), hypothyroidism, lichen sclerosus, HSV, OA.    PT Comments  Pt progressing towards physical therapy goals. Continues to have difficulty with quad activation and ankle DF on the R, and reports numbness from top of the foot up to knee anteriorly. Knee immobilizer utilized this session due to decreased quad activation. Pt became hot and lightheaded with ambulation. However, VSS upon return to room: 135/69, 57 bpm HR, 98% SpO2 on RA. Will continue to follow.    If plan is discharge home, recommend the following: A little help with walking and/or transfers;A little help with bathing/dressing/bathroom;Assistance with cooking/housework;Assist for transportation;Help with stairs or ramp for entrance   Can travel by private vehicle        Equipment Recommendations  BSC/3in1 (bariatric)    Recommendations for Other Services       Precautions / Restrictions Precautions Precautions: Fall Recall of Precautions/Restrictions: Intact Required Braces or Orthoses: Knee Immobilizer - Right Restrictions Weight Bearing Restrictions Per Provider Order: No     Mobility  Bed Mobility Overal bed mobility: Needs Assistance Bed Mobility: Supine to Sit     Supine to sit: Min assist     General bed mobility comments: Pt required assistance to advance RLE towards EOB. Knee immobilizer donned.    Transfers Overall transfer level: Needs assistance Equipment used: Rolling walker (2 wheels) Transfers: Sit to/from Stand Sit to Stand: Min assist           General transfer comment: Verbal cues for hand placement on seated surface for safety. Min assist to power up to full stand.    Ambulation/Gait Ambulation/Gait assistance: Contact  guard assist Gait Distance (Feet): 30 Feet Assistive device: Rolling walker (2 wheels) Gait Pattern/deviations: Step-to pattern, Decreased dorsiflexion - left, Decreased weight shift to left, Trunk flexed, Wide base of support Gait velocity: Decreased Gait velocity interpretation: <1.31 ft/sec, indicative of household ambulator   General Gait Details: VC's for sequencing and general safety. No assist required but hands on guarding throughout for safety.   Stairs             Wheelchair Mobility     Tilt Bed    Modified Rankin (Stroke Patients Only)       Balance Overall balance assessment: Needs assistance Sitting-balance support: Feet supported Sitting balance-Leahy Scale: Good     Standing balance support: Bilateral upper extremity supported, During functional activity, Reliant on assistive device for balance Standing balance-Leahy Scale: Poor                              Communication Communication Communication: No apparent difficulties  Cognition Arousal: Alert Behavior During Therapy: WFL for tasks assessed/performed   PT - Cognitive impairments: No apparent impairments                         Following commands: Intact      Cueing Cueing Techniques: Verbal cues  Exercises Total Joint Exercises Ankle Circles/Pumps: 10 reps, AAROM, Right, AROM, Left, Supine Quad Sets: 10 reps, AROM, Right, Supine Short Arc Quad: 10 reps, AAROM, Right, Supine (Eccentric lower) Heel Slides: 10 reps, AAROM, Right, Supine Hip ABduction/ADduction: 10 reps, AAROM, Right, Supine  General Comments        Pertinent Vitals/Pain Pain Assessment Pain Assessment: Faces Faces Pain Scale: Hurts little more Pain Location: thigh Pain Descriptors / Indicators: Operative site guarding, Grimacing Pain Intervention(s): Monitored during session, Limited activity within patient's tolerance, Repositioned    Home Living                           Prior Function            PT Goals (current goals can now be found in the care plan section) Acute Rehab PT Goals Patient Stated Goal: to do everything right PT Goal Formulation: With patient Time For Goal Achievement: 04/23/24 Potential to Achieve Goals: Good Progress towards PT goals: Progressing toward goals    Frequency    7X/week      PT Plan      Co-evaluation              AM-PAC PT 6 Clicks Mobility   Outcome Measure  Help needed turning from your back to your side while in a flat bed without using bedrails?: A Little Help needed moving from lying on your back to sitting on the side of a flat bed without using bedrails?: A Little Help needed moving to and from a bed to a chair (including a wheelchair)?: A Little Help needed standing up from a chair using your arms (e.g., wheelchair or bedside chair)?: A Little Help needed to walk in hospital room?: A Lot Help needed climbing 3-5 steps with a railing? : A Little 6 Click Score: 17    End of Session Equipment Utilized During Treatment: Gait belt Activity Tolerance: Patient tolerated treatment well Patient left: in chair;with call bell/phone within reach Nurse Communication: Mobility status PT Visit Diagnosis: Other abnormalities of gait and mobility (R26.89);Difficulty in walking, not elsewhere classified (R26.2);Pain Pain - Right/Left: Right Pain - part of body: Knee     Time: 9141-9065 PT Time Calculation (min) (ACUTE ONLY): 36 min  Charges:    $Gait Training: 8-22 mins $Therapeutic Exercise: 8-22 mins PT General Charges $$ ACUTE PT VISIT: 1 Visit                     [name], PT, DPT Acute Rehabilitation Services Secure Chat Preferred Office: [phone]    [name] 04/10/2024, 10:16 AM

## 2024-04-10 NOTE — Progress Notes (Signed)
 Physical Therapy Treatment  Patient Details Name: [name] MRN: [id number] DOB: 1960-12-30 Today's Date: 04/10/2024   History of Present Illness Pt is a 63 y/o female who presents to [facility] hospital on 04/09/2024 for elective R TKA. PMH includes L THA (12/2023), hypothyroidism, lichen sclerosus, HSV, OA.    PT Comments  Pt seen for afternoon session to progress gait training, initiate stair training and review HEP. Pt motivated to participate. Pt completed stair training and reports feeling comfortable. She was educated on car transfer, knee immobilizer use, and appropriate activity progression at home. Will continue to follow and progress as able per POC.    If plan is discharge home, recommend the following: A little help with walking and/or transfers;A little help with bathing/dressing/bathroom;Assistance with cooking/housework;Assist for transportation;Help with stairs or ramp for entrance   Can travel by private vehicle        Equipment Recommendations  BSC/3in1 (bariatric)    Recommendations for Other Services       Precautions / Restrictions Precautions Precautions: Fall Recall of Precautions/Restrictions: Intact Required Braces or Orthoses: Knee Immobilizer - Right Restrictions Weight Bearing Restrictions Per Provider Order: No     Mobility  Bed Mobility Overal bed mobility: Needs Assistance Bed Mobility: Supine to Sit     Supine to sit: Min assist     General bed mobility comments: Pt required assistance to advance RLE towards EOB. Knee immobilizer donned.    Transfers Overall transfer level: Needs assistance Equipment used: Rolling walker (2 wheels) Transfers: Sit to/from Stand Sit to Stand: Min assist           General transfer comment: Verbal cues for hand placement on seated surface for safety. Min assist to power up to full stand.    Ambulation/Gait Ambulation/Gait assistance: Contact guard assist Gait Distance (Feet): 100 Feet Assistive  device: Rolling walker (2 wheels) Gait Pattern/deviations: Step-to pattern, Decreased dorsiflexion - left, Decreased weight shift to left, Trunk flexed, Wide base of support Gait velocity: Decreased Gait velocity interpretation: <1.31 ft/sec, indicative of household ambulator   General Gait Details: VC's for sequencing and general safety. No assist required but hands on guarding throughout for safety.   Stairs Stairs: Yes Stairs assistance: Contact guard assist Stair Management: Two rails, Step to pattern, Forwards Number of Stairs: 4 General stair comments: VC's for sequencing and general safety.   Wheelchair Mobility     Tilt Bed    Modified Rankin (Stroke Patients Only)       Balance Overall balance assessment: Needs assistance Sitting-balance support: Feet supported Sitting balance-Leahy Scale: Good     Standing balance support: Bilateral upper extremity supported, During functional activity, Reliant on assistive device for balance Standing balance-Leahy Scale: Poor                              Communication Communication Communication: No apparent difficulties  Cognition Arousal: Alert Behavior During Therapy: WFL for tasks assessed/performed   PT - Cognitive impairments: No apparent impairments                         Following commands: Intact      Cueing Cueing Techniques: Verbal cues  Exercises Total Joint Exercises Ankle Circles/Pumps: 10 reps, AAROM, Right, AROM, Left, Supine Quad Sets: 10 reps, AROM, Right, Supine Short Arc Quad: 10 reps, AAROM, Right, Supine (Eccentric lower) Heel Slides: 10 reps, AAROM, Right, Supine Hip ABduction/ADduction: 10 reps, AAROM,  Right, Supine Long Arc Quad: 10 reps, AAROM, Right, Seated (Eccentric lower) Goniometric ROM: 1-69 AAROM R knee in sitting    General Comments        Pertinent Vitals/Pain Pain Assessment Pain Assessment: Faces Faces Pain Scale: Hurts little more Pain Location:  thigh Pain Descriptors / Indicators: Operative site guarding, Grimacing Pain Intervention(s): Limited activity within patient's tolerance, Monitored during session, Repositioned    Home Living                          Prior Function            PT Goals (current goals can now be found in the care plan section) Acute Rehab PT Goals Patient Stated Goal: to do everything right PT Goal Formulation: With patient Time For Goal Achievement: 04/23/24 Potential to Achieve Goals: Good Progress towards PT goals: Progressing toward goals    Frequency    7X/week      PT Plan      Co-evaluation              AM-PAC PT 6 Clicks Mobility   Outcome Measure  Help needed turning from your back to your side while in a flat bed without using bedrails?: A Little Help needed moving from lying on your back to sitting on the side of a flat bed without using bedrails?: A Little Help needed moving to and from a bed to a chair (including a wheelchair)?: A Little Help needed standing up from a chair using your arms (e.g., wheelchair or bedside chair)?: A Little Help needed to walk in hospital room?: A Lot Help needed climbing 3-5 steps with a railing? : A Little 6 Click Score: 17    End of Session Equipment Utilized During Treatment: Gait belt Activity Tolerance: Patient tolerated treatment well Patient left: in chair;with call bell/phone within reach Nurse Communication: Mobility status PT Visit Diagnosis: Other abnormalities of gait and mobility (R26.89);Difficulty in walking, not elsewhere classified (R26.2);Pain Pain - Right/Left: Right Pain - part of body: Knee     Time: 1322-1407 PT Time Calculation (min) (ACUTE ONLY): 45 min  Charges:    $Gait Training: 23-37 mins $Therapeutic Exercise: 8-22 mins PT General Charges $$ ACUTE PT VISIT: 1 Visit                     [name], PT, DPT Acute Rehabilitation Services Secure Chat Preferred Office:  [phone]    [name] 04/10/2024, 3:09 PM

## 2024-04-10 NOTE — Discharge Summary (Signed)
 Patient ID: [name] MRN: [id number] DOB/AGE: 61-Feb-1962 61 y.o.  Admit date: 04/09/2024 Discharge date: 04/10/2024  Admission Diagnoses:  Principal Problem:   Unilateral primary osteoarthritis, right knee Active Problems:   Status post total right knee replacement   Discharge Diagnoses:  Same  Past Medical History:  Diagnosis Date   Abnormal Pap smear    gland. cells -> colpo -> neg endo bx, CIN I   Arthritis    Hyperlipidemia    Hypothyroidism    STD (sexually transmitted disease)    HSV    Surgeries: Procedure(s): ARTHROPLASTY, KNEE, TOTAL on 04/09/2024   Consultants:   Discharged Condition: Improved  Hospital Course: [name] is an 63 y.o. female who was admitted 04/09/2024 for operative treatment ofUnilateral primary osteoarthritis, right knee. Patient has severe unremitting pain that affects sleep, daily activities, and work/hobbies. After pre-op clearance the patient was taken to the operating room on 04/09/2024 and underwent  Procedure(s): ARTHROPLASTY, KNEE, TOTAL.    Patient was given perioperative antibiotics:  Anti-infectives (From admission, onward)    Start     Dose/Rate Route Frequency Ordered Stop   04/09/24 0600  ceFAZolin  (ANCEF ) IVPB 2g/100 mL premix        2 g 200 mL/hr over 30 Minutes Intravenous On call to O.R. 04/09/24 9441 04/09/24 0813        Patient was given sequential compression devices, early ambulation, and chemoprophylaxis to prevent DVT.  Inpatient Morphine Milligram Equivalents Per Day 12/9 - 12/10   Values displayed are in units of MME/Day    Order Start / End Date Yesterday Today    oxyCODONE  (Oxy IR/ROXICODONE ) immediate release tablet 5 mg 12/9 - 12/9 7.5 of Unknown --    oxyCODONE  (ROXICODONE ) 5 MG/5ML solution 5 mg 12/9 - 12/9 0 of Unknown --      Group total: 7.5 of Unknown     fentaNYL  (SUBLIMAZE ) injection 25-50 mcg 12/9 - 12/9 45 of 45-90 --    fentaNYL  (SUBLIMAZE ) injection 12/9 - 12/9 *75 of 75 --     HYDROmorphone  (DILAUDID ) injection 0.25-0.5 mg 12/9 - 12/9 40 of 40-80 --    Daily Totals  * 167.5 of Unknown (at least 160-245) --  *One-Step medication  Calculation Errors     Order Type Date Details   oxyCODONE  (Oxy IR/ROXICODONE ) immediate release tablet 5 mg Ordered Dose -- Insufficient frequency information   oxyCODONE  (ROXICODONE ) 5 MG/5ML solution 5 mg Ordered Dose -- Insufficient frequency information            Patient benefited maximally from hospital stay and there were no complications.    Recent vital signs: Patient Vitals for the past 24 hrs:  BP Temp Temp src Pulse Resp SpO2  04/10/24 1306 135/84 97.9 F (36.6 C) Oral 83 20 97 %  04/10/24 1246 (!) 150/91 98.7 F (37.1 C) Oral 83 19 100 %  04/10/24 0743 115/72 99.2 F (37.3 C) Oral 81 19 97 %  04/10/24 0523 119/63 99.8 F (37.7 C) Oral 75 16 100 %  04/09/24 2312 119/65 99.4 F (37.4 C) Oral 60 -- --  04/09/24 2008 (!) 109/51 99.6 F (37.6 C) Oral 81 20 100 %  04/09/24 1720 (!) 106/59 98.6 F (37 C) Oral 67 19 100 %     Recent laboratory studies:  Recent Labs    04/08/24 1500 04/10/24 0656  WBC 5.7 5.8  HGB 12.8 10.9*  HCT 40.5 33.7*  PLT 272 202  NA 138 137  K  3.8 3.7  CL 105 103  CO2 26 27  BUN 26* 17  CREATININE 0.61 0.64  GLUCOSE 92 126*  CALCIUM  8.8* 8.0*     Discharge Medications:   Allergies as of 04/10/2024       Reactions   Vicodin [hydrocodone-acetaminophen ]    Faint, hot and sweaty        Medication List     TAKE these medications    acetaminophen  650 MG CR tablet Commonly known as: TYLENOL  Take 1,300 mg by mouth every 8 (eight) hours as needed for pain.   ACIDOPHILUS PO Take 1 capsule by mouth daily.   Aspirin  Low Dose 81 MG chewable tablet Generic drug: aspirin  Chew 1 tablet (81 mg total) by mouth 2 (two) times daily.   atorvastatin  20 MG tablet Commonly known as: LIPITOR Take 20 mg by mouth daily.   Biotin  1000 MCG tablet Take 1,000 mcg by mouth daily.    CALCIUM  600 PO Take 600 mg by mouth daily.   FeroSul 325 (65 Fe) MG tablet Generic drug: ferrous sulfate  Take 325 mg by mouth daily.   levothyroxine  100 MCG tablet Commonly known as: SYNTHROID  Take 200-300 mcg by mouth See admin instructions. Take 200 mcg by mouth in the morning and take 300 mcg on Sunday   lisdexamfetamine 30 MG capsule Commonly known as: VYVANSE Take 30 mg by mouth every morning.   MULTIVITAMIN PO Take 1 tablet by mouth daily.   oxyCODONE 5 MG immediate release tablet Commonly known as: Oxy IR/ROXICODONE Take 1-2 tablets (5-10 mg total) by mouth every 4 (four) hours as needed for moderate pain (pain score 4-6) (pain score 4-6).   tiZANidine 4 MG tablet Commonly known as: ZANAFLEX Take 1 tablet (4 mg total) by mouth every 6 (six) hours as needed for muscle spasms.   VITAMIN D (CHOLECALCIFEROL) PO Take 1 tablet by mouth daily.               Durable Medical Equipment  (From admission, onward)           Start     Ordered   04/09/24 1551  For home use only DME Bedside commode  Once       Comments: Pt requires a bariatric size due to body habitus  Question:  Patient needs a bedside commode to treat with the following condition  Answer:  Weakness   04/09/24 1550   04/09/24 1311  DME 3 n 1  Once       Comments: Bariatric size   04/09/24 1310   04/09/24 1311  DME Walker rolling  Once       Question Answer Comment  Walker: With 5 Inch Wheels   Patient needs a walker to treat with the following condition Status post total right knee replacement      12 /09/25 1310            Diagnostic Studies: DG Knee Right Port Result Date: 04/09/2024 CLINICAL DATA:  Status post right knee replacement. EXAM: PORTABLE RIGHT KNEE - 1-2 VIEW COMPARISON:  None Available. FINDINGS: Right knee arthroplasty in expected alignment. No periprosthetic lucency or fracture. There has been patellar resurfacing. Recent postsurgical change includes air and edema in the  soft tissues and joint space. Anterior skin staples in place. IMPRESSION: Right knee arthroplasty without immediate postoperative complication. Electronically Signed   By: Andrea Gasman M.D.   On: 04/09/2024 11:13    Disposition: Discharge disposition: 01-Home or Self Care  Follow-up Information     [facility] Follow up.   Specialty: Home Health Services Why: [facility] will contact you for the first home visit Contact information: [address] [place] [address] [phone]         Vernetta Lonni GRADE, MD Follow up on 04/22/2024.   Specialty: Orthopedic Surgery Contact information: [address] [place]  [place] [address] [phone]                  Signed: Lonni GRADE Vernetta 04/10/2024, 4:27 PM

## 2024-04-10 NOTE — Progress Notes (Signed)
 Subjective: 1 Day Post-Op Procedure(s) (LRB): ARTHROPLASTY, KNEE, TOTAL (Right) Patient reports pain as moderate.    Objective: Vital signs in last 24 hours: Temp:  [98 F (36.7 C)-99.8 F (37.7 C)] 99.8 F (37.7 C) (12/10 0523) Pulse Rate:  [60-82] 75 (12/10 0523) Resp:  [11-20] 16 (12/10 0523) BP: (106-164)/(51-106) 119/63 (12/10 0523) SpO2:  [93 %-100 %] 100 % (12/10 0523)  Intake/Output from previous day: 12/09 0701 - 12/10 0700 In: 1180 [P.O.:480; I.V.:500; IV Piggyback:200] Out: 50 [Blood:50] Intake/Output this shift: No intake/output data recorded.  Recent Labs    04/08/24 1500  HGB 12.8   Recent Labs    04/08/24 1500  WBC 5.7  RBC 4.71  HCT 40.5  PLT 272   Recent Labs    04/08/24 1500  NA 138  K 3.8  CL 105  CO2 26  BUN 26*  CREATININE 0.61  GLUCOSE 92  CALCIUM  8.8*   No results for input(s): LABPT, INR in the last 72 hours.  Sensation intact distally Intact pulses distally Dorsiflexion/Plantar flexion intact Incision: dressing C/D/I Compartment soft   Assessment/Plan: 1 Day Post-Op Procedure(s) (LRB): ARTHROPLASTY, KNEE, TOTAL (Right) Up with therapy      [name] 04/10/2024, 7:18 AM

## 2024-04-10 NOTE — Progress Notes (Signed)
 Patient alert and oriented, ambulate, voided. Surgical site clean and dry no sign of infection. D/c instructions explain and given to the patient. All questions answered. Patient d/c home with bariatric BSC per order.

## 2024-04-15 ENCOUNTER — Other Ambulatory Visit: Payer: Self-pay | Admitting: Orthopaedic Surgery

## 2024-04-15 MED ORDER — OXYCODONE HCL 5 MG PO TABS: 5.0000 mg | ORAL_TABLET | Freq: Four times a day (QID) | ORAL | 0 refills | Status: DC | PRN

## 2024-04-17 ENCOUNTER — Ambulatory Visit: Admitting: Orthopaedic Surgery

## 2024-04-17 ENCOUNTER — Telehealth: Payer: Self-pay | Admitting: Orthopaedic Surgery

## 2024-04-17 ENCOUNTER — Other Ambulatory Visit: Payer: Self-pay

## 2024-04-17 DIAGNOSIS — Z96651 Presence of right artificial knee joint: Secondary | ICD-10-CM

## 2024-04-17 NOTE — Telephone Encounter (Signed)
 Patient called. She would like a referral for outpatient PT to start 12/22

## 2024-04-18 ENCOUNTER — Other Ambulatory Visit: Payer: Self-pay

## 2024-04-18 ENCOUNTER — Encounter: Payer: Self-pay | Admitting: Orthopaedic Surgery

## 2024-04-18 ENCOUNTER — Other Ambulatory Visit: Payer: Self-pay | Admitting: Radiology

## 2024-04-18 MED ORDER — AMOXICILLIN 500 MG PO TABS: ORAL_TABLET | ORAL | 0 refills | Status: AC

## 2024-04-19 ENCOUNTER — Other Ambulatory Visit: Payer: Self-pay | Admitting: Orthopaedic Surgery

## 2024-04-19 ENCOUNTER — Encounter: Payer: Self-pay | Admitting: Orthopaedic Surgery

## 2024-04-19 MED ORDER — OXYCODONE HCL 5 MG PO TABS: 5.0000 mg | ORAL_TABLET | Freq: Four times a day (QID) | ORAL | 0 refills | Status: DC | PRN

## 2024-04-22 ENCOUNTER — Encounter: Payer: Self-pay | Admitting: Orthopaedic Surgery

## 2024-04-22 ENCOUNTER — Ambulatory Visit: Admitting: Orthopaedic Surgery

## 2024-04-22 DIAGNOSIS — Z96651 Presence of right artificial knee joint: Secondary | ICD-10-CM

## 2024-04-22 MED ORDER — OXYCODONE HCL 5 MG PO TABS: 5.0000 mg | ORAL_TABLET | Freq: Four times a day (QID) | ORAL | 0 refills | Status: AC | PRN

## 2024-04-22 MED ORDER — DICLOFENAC SODIUM 75 MG PO TBEC: 75.0000 mg | DELAYED_RELEASE_TABLET | Freq: Two times a day (BID) | ORAL | 1 refills | Status: AC | PRN

## 2024-04-22 NOTE — Progress Notes (Signed)
 The patient is here today at 2 weeks status post a right total knee replacement to treat significant and severe right knee arthritis.  We have replaced her left hip as well secondary to severe arthritis of the left hip.  She reports that she is doing well.  She starts outpatient physical therapy tomorrow.  On exam her calf is soft on her right operative side.  Her incision looks good.  Staples been removed and Steri-Strips applied.  She can flex and extend at the ankle.  Her extension is almost full at the knee and her flexion is limited.  Some of this is also soft tissue related.  She does have a large soft tissue envelope around her knees.  She is incredibly motivated.  I will send in some more oxycodone  for her.  She can stop her baby aspirin  twice daily and I will also send in some diclofenac .  We will see her back in a month and at that visit actually would like a standing AP and lateral of her right operative knee.  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

## 2024-04-22 NOTE — Therapy (Incomplete)
 " OUTPATIENT PHYSICAL THERAPY  EVALUATION   Patient Name: [name] MRN: [id number] DOB:[birth year], 63 y.o., female Today's Date: 04/22/2024  END OF SESSION:   Past Medical History:  Diagnosis Date   Abnormal Pap smear    gland. cells -> colpo -> neg endo bx, CIN I   Arthritis    Hyperlipidemia    Hypothyroidism    STD (sexually transmitted disease)    HSV   Past Surgical History:  Procedure Laterality Date   CESAREAN SECTION  1996   COLONOSCOPY     GASTRIC BYPASS  08/2003   TOTAL HIP ARTHROPLASTY Left 12/05/2023   Procedure: ARTHROPLASTY, HIP, TOTAL, ANTERIOR APPROACH;  Surgeon: Vernetta Lonni GRADE, MD;  Location: [facility] OR;  Service: Orthopedics;  Laterality: Left;   TOTAL KNEE ARTHROPLASTY Right 04/09/2024   Procedure: ARTHROPLASTY, KNEE, TOTAL;  Surgeon: Vernetta Lonni GRADE, MD;  Location: [facility] OR;  Service: Orthopedics;  Laterality: Right;   VAGINAL DELIVERY  1994   Patient Active Problem List   Diagnosis Date Noted   Status post total right knee replacement 04/09/2024   Unilateral primary osteoarthritis, right knee 04/08/2024   Status post total replacement of left hip 12/05/2023   Lichen sclerosus 06/04/2019   HSV (herpes simplex virus) infection 11/25/2016   Hypothyroid 11/25/2016   Thyroid  nodule 07/27/2015    PCP: Teresa Channel, MD  REFERRING PROVIDER: Vernetta Lonni GRADE, MD  REFERRING DIAG: [phone] (ICD-10-CM) - Status post right knee replacement  THERAPY DIAG:  No diagnosis found.  Rationale for Evaluation and Treatment: Rehabilitation  ONSET DATE: 04/09/2024  SUBJECTIVE:   SUBJECTIVE STATEMENT: S/p Rt knee replacement on 04/09/2024  PERTINENT HISTORY: PMH includes L THA (12/2023), hypothyroidism, lichen sclerosus, HSV, OA.   PAIN:  NPRS scale: ***/10 Pain location: *** Pain description: *** Aggravating factors: *** Relieving factors: ***  PRECAUTIONS: None  WEIGHT BEARING RESTRICTIONS: No  FALLS:  Has patient fallen in  last 6 months? No  LIVING ENVIRONMENT: Lives with: {OPRC lives with:25569::lives with their family} Lives in: {Lives in:25570} Stairs: {opstairs:27293} Has following equipment at home: {Assistive devices:23999}  OCCUPATION: ***  PLOF: Independent  PATIENT GOALS: ***  Next MD visit:   OBJECTIVE:   DIAGNOSTIC FINDINGS: ***  PATIENT SURVEYS:  Patient-Specific Activity Scoring Scheme  0 represents unable to perform. 10 represents able to perform at prior level. 0 1 2 3 4 5 6 7 8 9  10 (Date and Score)   Activity Eval     1. ***      2. ***      3. ***    4.    5.    Score ***    Total score = sum of the activity scores/number of activities Minimum detectable change (90%CI) for average score = 2 points Minimum detectable change (90%CI) for single activity score = 3 points  COGNITION: Overall cognitive status: WFL    SENSATION: {sensation:27233}  EDEMA:  {edema:24020}  MUSCLE LENGTH: Hamstrings: Right *** deg; Left *** deg Debby test: Right *** deg; Left *** deg  POSTURE:  {posture:25561}  PALPATION: ***  LOWER EXTREMITY ROM:   ROM Right eval Left eval  Hip flexion    Hip extension    Hip abduction    Hip adduction    Hip internal rotation    Hip external rotation    Knee flexion    Knee extension    Ankle dorsiflexion    Ankle plantarflexion    Ankle inversion    Ankle eversion     (  Blank rows = not tested)  LOWER EXTREMITY MMT:  MMT Right eval Left eval  Hip flexion    Hip extension    Hip abduction    Hip adduction    Hip internal rotation    Hip external rotation    Knee flexion    Knee extension    Ankle dorsiflexion    Ankle plantarflexion    Ankle inversion    Ankle eversion     (Blank rows = not tested)  LOWER EXTREMITY SPECIAL TESTS:  {LEspecialtests:26242}  FUNCTIONAL TESTS:  18 inch chair transfer: Lt SLS: Rt SLS:  GAIT: Distance walked: *** Assistive device utilized: {Assistive  devices:23999} Level of assistance: {Levels of assistance:24026} Comments: ***                                                                                                                                                                        TODAY'S TREATMENT                                                                          DATE: Therex:    HEP instruction/performance c cues for techniques, handout provided.  Trial set performed of each for comprehension and symptom assessment.  See below for exercise list  PATIENT EDUCATION:  Education details: HEP, POC Person educated: Patient Education method: Explanation, Demonstration, Verbal cues, and Handouts Education comprehension: verbalized understanding, returned demonstration, and verbal cues required  HOME EXERCISE PROGRAM: ***  ASSESSMENT:  CLINICAL IMPRESSION: Patient is a 63 y.o. who comes to clinic with complaints of Rt knee pain s/p recent Rt TKA with mobility, strength and movement coordination deficits that impair their ability to perform usual daily and recreational functional activities without increase difficulty/symptoms at this time.  Patient to benefit from skilled PT services to address impairments and limitations to improve to previous level of function without restriction secondary to condition.   OBJECTIVE IMPAIRMENTS: {opptimpairments:25111}.   ACTIVITY LIMITATIONS: {activitylimitations:27494}  PARTICIPATION LIMITATIONS: {participationrestrictions:25113}  PERSONAL FACTORS: {Personal factors:25162} are also affecting patient's functional outcome.   REHAB POTENTIAL: {rehabpotential:25112}  CLINICAL DECISION MAKING: {clinical decision making:25114}  EVALUATION COMPLEXITY: {Evaluation complexity:25115}   GOALS: Goals reviewed with patient? Yes  SHORT TERM GOALS: (target date for Short term goals are 3 weeks ***)   1.  Patient will demonstrate independent use of home exercise program to maintain  progress from in clinic treatments.  Goal status: New  LONG TERM GOALS: (target dates for all long  term goals are 10 weeks  *** )   1. Patient will demonstrate/report pain at worst less than or equal to 2/10 to facilitate minimal limitation in daily activity secondary to pain symptoms.  Goal status: New   2. Patient will demonstrate independent use of home exercise program to facilitate ability to maintain/progress functional gains from skilled physical therapy services.  Goal status: New   3. Patient will demonstrate Patient specific functional scale avg > or = *** to indicate reduced disability due to condition.   Goal status: New   4.  Patient will demonstrate *** LE MMT 5/5 throughout to faciltiate usual transfers, stairs, squatting at [facility] for daily life.   Goal status: New   5.  Patient will demonstrate Goal status: New   6.  *** Goal status: New   7.  *** Goal Status: New   PLAN:  PT FREQUENCY: 1-2x/week  PT DURATION: 10 weeks  PLANNED INTERVENTIONS: Can include 02853- PT Re-evaluation, 97110-Therapeutic exercises, 97530- Therapeutic activity, [id number]- Neuromuscular re-education, 97535- Self Care, 97140- Manual therapy, [id number]- Gait training, [phone]- Orthotic Fit/training, [phone]- Canalith repositioning, [id number]- Aquatic Therapy, [phone]- Electrical stimulation (unattended), [id number] Physical performance testing, 97016- Vasopneumatic device, [id number]- Ultrasound, [id number]- Traction (mechanical), [id number]- Ionotophoresis 4mg /ml Dexamethasone ,  79439 - Needle insertion w/o injection 1 or 2 muscles, 20561 - Needle insertion w/o injection 3 or more muscles.   Patient/Family education, Balance training, Stair training, Taping, Dry Needling, Joint mobilization, Joint manipulation, Spinal manipulation, Spinal mobilization, Scar mobilization, Vestibular training, Visual/preceptual remediation/compensation, DME instructions, Cryotherapy, and Moist heat.  All performed as medically necessary.  All  included unless contraindicated  PLAN FOR NEXT SESSION: Review HEP knowledge/results.    Ozell Silvan, PT, DPT, OCS, ATC 04/22/2024  9:24 AM    "

## 2024-04-23 ENCOUNTER — Ambulatory Visit (INDEPENDENT_AMBULATORY_CARE_PROVIDER_SITE_OTHER)

## 2024-04-23 ENCOUNTER — Ambulatory Visit: Admitting: Physical Therapy

## 2024-04-23 DIAGNOSIS — M25661 Stiffness of right knee, not elsewhere classified: Secondary | ICD-10-CM | POA: Diagnosis not present

## 2024-04-23 DIAGNOSIS — R600 Localized edema: Secondary | ICD-10-CM

## 2024-04-23 DIAGNOSIS — R2689 Other abnormalities of gait and mobility: Secondary | ICD-10-CM

## 2024-04-23 DIAGNOSIS — M25561 Pain in right knee: Secondary | ICD-10-CM

## 2024-04-23 DIAGNOSIS — M6281 Muscle weakness (generalized): Secondary | ICD-10-CM | POA: Diagnosis not present

## 2024-04-23 NOTE — Therapy (Signed)
 " OUTPATIENT PHYSICAL THERAPY LOWER EXTREMITY EVALUATION   Patient Name: [name] MRN: [id number] DOB:1961/01/06, 63 y.o., female Today's Date: 04/23/2024  END OF SESSION:  PT End of Session - 04/23/24 1313     Visit Number 1    Number of Visits 20    Date for Recertification  07/02/24    Authorization Type [employer] NO COPAY, DED AND OOP MET    PT Start Time 1310    PT Stop Time 1347    PT Time Calculation (min) 37 min    Equipment Utilized During Treatment Other (comment)   LE strap used for bed mobility   Activity Tolerance Patient tolerated treatment well;Patient limited by pain    Behavior During Therapy WFL for tasks assessed/performed          Past Medical History:  Diagnosis Date   Abnormal Pap smear    gland. cells -> colpo -> neg endo bx, CIN I   Arthritis    Hyperlipidemia    Hypothyroidism    STD (sexually transmitted disease)    HSV   Past Surgical History:  Procedure Laterality Date   CESAREAN SECTION  1996   COLONOSCOPY     GASTRIC BYPASS  08/2003   TOTAL HIP ARTHROPLASTY Left 12/05/2023   Procedure: ARTHROPLASTY, HIP, TOTAL, ANTERIOR APPROACH;  Surgeon: Vernetta Lonni GRADE, MD;  Location: [facility] OR;  Service: Orthopedics;  Laterality: Left;   TOTAL KNEE ARTHROPLASTY Right 04/09/2024   Procedure: ARTHROPLASTY, KNEE, TOTAL;  Surgeon: Vernetta Lonni GRADE, MD;  Location: [facility] OR;  Service: Orthopedics;  Laterality: Right;   VAGINAL DELIVERY  1994   Patient Active Problem List   Diagnosis Date Noted   Status post total right knee replacement 04/09/2024   Status post total replacement of left hip 12/05/2023   Lichen sclerosus 06/04/2019   HSV (herpes simplex virus) infection 11/25/2016   Hypothyroid 11/25/2016   Thyroid  nodule 07/27/2015    PCP: Montie Pizza, MD   REFERRING PROVIDER: Lonni Vernetta, MD   REFERRING DIAG: [phone] (ICD-10-CM) - Status post right knee replacement  THERAPY DIAG:  Acute pain of right knee  Stiffness of right  knee, not elsewhere classified  Muscle weakness (generalized)  Other abnormalities of gait and mobility  Localized edema  Rationale for Evaluation and Treatment: Rehabilitation  ONSET DATE: Rt TKA 04/09/2024  SUBJECTIVE:   SUBJECTIVE STATEMENT: I think I'm doing good.   PERTINENT HISTORY: Patient reports that she has always gained and lost weight (up to 100 pounds) throughout her life and never quit walking for physical activity which led to degradation of the Lt hip and Rt knee. Patient underwent Lt THA in August 2025 and then underwent Rt TKA 04/09/2024. Patient notes no other surgery or trauma to joint or surrounding joints. Patient endorses sleep continuing to be impacted and is still sleeping in a recliner/lift chair. Patient notes that MD recently prescribed diclofenac .   See PMH or personal factors for in depth comorbidities    PAIN:  Are you having pain? Yes: NPRS scale: 0/10 at rest, 7.5/10 at worst  Pain location: Rt knee (mostly joint line) Pain description: throbbing, pinches/twinges Aggravating factors: resting/difficult to get comfortable, bending the knee Relieving factors: medications, tylenol  arthritis, ice, mobility   PRECAUTIONS: Fall  RED FLAGS: Bowel or bladder incontinence: Yes: not followed on this, but believes it's from the medication (muscle relaxers) and Cauda equina syndrome: No   WEIGHT BEARING RESTRICTIONS: No  FALLS:  Has patient fallen in last 6 months? No  LIVING ENVIRONMENT: Lives with: lives with their spouse Lives in: House/apartment Stairs: Yes: Internal: 20 steps; can reach both and External: 4 steps; on left going up Has following equipment at home: Single point cane, Quad cane small base, Walker - 2 wheeled, Environmental Consultant - 4 wheeled, and Crutches  OCCUPATION: work from home, [employer] inpatient coordinator   PLOF: Independent; prior to hip surgery in August she was ambulating with AD (crutches or rollator)   PATIENT GOALS: walk unassisted    NEXT MD VISIT: 05/20/2024  OBJECTIVE:  Note: Objective measures were completed at Evaluation unless otherwise noted.  DIAGNOSTIC FINDINGS:  IMPRESSION: Right knee arthroplasty without immediate postoperative complication  PATIENT SURVEYS:  PSFS: THE PATIENT SPECIFIC FUNCTIONAL SCALE  Place score of 0-10 (0 = unable to perform activity and 10 = able to perform activity at the same level as before injury or problem)  Activity Date: 04/23/2024    Walking unassisted  2    2. Stairs unassisted  0    3. Physical activity (walking, etc.)  0    4.      Total Score 0.66      Total Score = Sum of activity scores/number of activities  Minimally Detectable Change: 3 points (for single activity); 2 points (for average score)  [employer] [facility] (nd). The Patient Specific Functional Scale . Retrieved from [email or web address]   COGNITION: Overall cognitive status: Within functional limits for tasks assessed     SENSATION: Subjective numbness of Rt dorsal foot and lower leg secondary to nerve block and edema   POSTURE: rounded shoulders, forward head, and increased thoracic kyphosis  PALPATION: Not TTP around joint lines   LOWER EXTREMITY ROM:  Active ROM Right Eval 04/23/2024 Left Eval 04/23/2024  Hip flexion    Hip extension    Hip abduction    Hip adduction    Hip internal rotation    Hip external rotation    Knee flexion 67deg supine AROM after 3 heel slides   Knee extension 2deg flexion   Ankle dorsiflexion    Ankle plantarflexion    Ankle inversion    Ankle eversion     (Blank rows = not tested)  LOWER EXTREMITY MMT:  MMT Right Eval 04/23/2024 Left Eval 04/23/2024  Hip flexion    Hip extension    Hip abduction    Hip adduction    Hip internal rotation    Hip external rotation    Knee flexion    Knee extension    Ankle dorsiflexion    Ankle plantarflexion    Ankle inversion    Ankle  eversion     (Blank rows = not tested)  GAIT: Distance walked: not objectively measured  Assistive device utilized: Walker - 4 wheeled Level of assistance: supervision  Comments: decreased Rt knee flexion with swing, antalgic pattern  TREATMENT DATE:  04/23/2024 TherEx:  HEP handout provided with patient performing one set of each activity for appropriate form. Verbal and tactile cues performed. PT provided patient with beach ball for supine heel slides with the strap   Self-Care:  POC    PATIENT EDUCATION:  Education details: HEP, POC Person educated: Patient Education method: Explanation, Demonstration, Tactile cues, Verbal cues, and Handouts Education comprehension: verbalized understanding, returned demonstration, verbal cues required, and tactile cues required  HOME EXERCISE PROGRAM: Access Code: [facility] URL: [email or web address] Date: 04/23/2024 Prepared by: Susannah Daring  Exercises - Supine Heel Slide with Strap  - 1 x daily - 7 x weekly - 3 sets - 10 reps - Supine Knee Extension Stretch on Towel Roll  - 1 x daily - 7 x weekly - 3 sets - 30sec hold - Quad Setting and Stretching  - 1 x daily - 7 x weekly - 2 sets - 10 reps - 30sec hold - Supine Ankle Pumps in Elevation on Pillows  - 1 x daily - 7 x weekly - 2 sets - 10 reps - Seated Knee Flexion AAROM  - 1 x daily - 7 x weekly - 2 sets - 10 reps - Seated Active Straight-Leg Raise  - 1 x daily - 7 x weekly - 2 sets - 10 reps - 2sec hold  ASSESSMENT:  CLINICAL IMPRESSION: Patient is a 63 y.o. F who was seen today for physical therapy evaluation and treatment for s/p Rt TKA presenting with motor coordination deficits, strength deficits, functional mobility deficits, ROM deficits, and pain levels. Patient is most limited with knee flexion ROM secondary to pain as well as chronic  reliance on ADs for ambulation. Patient will benefit from skilled PT to address above noted deficits.   OBJECTIVE IMPAIRMENTS: decreased activity tolerance, decreased balance, decreased coordination, difficulty walking, decreased ROM, decreased strength, increased edema, increased muscle spasms, impaired sensation, improper body mechanics, postural dysfunction, obesity, and pain.   ACTIVITY LIMITATIONS: carrying, sitting, standing, squatting, sleeping, stairs, transfers, and bed mobility  PARTICIPATION LIMITATIONS: cleaning, driving, shopping, community activity, and occupation  PERSONAL FACTORS: Fitness, Past/current experiences, Time since onset of injury/illness/exacerbation, and 3+ comorbidities: arthritis, HLD, hypothyroidism, body habitus are also affecting patient's functional outcome.   REHAB POTENTIAL: Good  CLINICAL DECISION MAKING: Evolving/moderate complexity  EVALUATION COMPLEXITY: Moderate   GOALS: Goals reviewed with patient? Yes  SHORT TERM GOALS: Target date: 05/14/2024 Patient will show compliance with initial HEP.  Baseline: Goal status: INITIAL  2.  Patient will report pain levels no greater than 6/10 in order to show an improved overall quality of life. Baseline:  Goal status: INITIAL  3.  Patient will increase knee flexion ROM to at least 85deg in order to improve functional mobility. Baseline:  Goal status: INITIAL  4.  Patient will increase PSFS to at least 2.66 in order show a significant improvement in subjective disability rating. Baseline:  Goal status: INITIAL   LONG TERM GOALS: Target date: 07/02/2024  Patient will show independence with final HEP in order to maintain and progress upon functional gains made within PT. Baseline:  Goal status: INITIAL  2.  Patient will report pain levels no greater than 3/10 in order to show an improved overall quality of life. Baseline:  Goal status: INITIAL  3.  Patient will increase PSFS to at least 4.66 in  order show a significant improvement in subjective disability rating. Baseline:  Goal status: INITIAL  4.  Patient will improve knee flexion ROM to at least  110deg in order to improve functional mobility. Baseline:  Goal status: INITIAL  5.  Patient will ambulate mod I with LRAD for 100' in order to return to more independence with physical activity. Baseline:  Goal status: INITIAL   PLAN:  PT FREQUENCY: 2x/week  PT DURATION: 10 weeks  PLANNED INTERVENTIONS: 97164- PT Re-evaluation, 97750- Physical Performance Testing, 97110-Therapeutic exercises, 97530- Therapeutic activity, [id number]- Neuromuscular re-education, 97535- Self Care, 02859- Manual therapy, [id number]- Gait training, [phone]- Orthotic Initial, [phone]- Orthotic/Prosthetic subsequent, [phone]- Canalith repositioning, [id number]- Aquatic Therapy, [phone]- Electrical stimulation (unattended), [phone]- Electrical stimulation (manual), [id number]- Vasopneumatic device, [id number]- Ultrasound, [id number]- Traction (mechanical), [id number]- Ionotophoresis 4mg /ml Dexamethasone , 79439 (1-2 muscles), 20561 (3+ muscles)- Dry Needling, Patient/Family education, Balance training, Stair training, Taping, Joint mobilization, Spinal manipulation, Spinal mobilization, Scar mobilization, Vestibular training, DME instructions, Cryotherapy, and Moist heat  PLAN FOR NEXT SESSION: review HEP, knee flexion ROM gains, quad strengthening, generalized LE strengthening/endurance, remind patient to schedule out according to frequency/duration plan    Susannah Daring, PT, DPT 04/23/2024 4:01 PM    "

## 2024-04-24 ENCOUNTER — Ambulatory Visit: Admitting: Rehabilitative and Restorative Service Providers"

## 2024-04-24 ENCOUNTER — Encounter: Payer: Self-pay | Admitting: Rehabilitative and Restorative Service Providers"

## 2024-04-24 DIAGNOSIS — M25661 Stiffness of right knee, not elsewhere classified: Secondary | ICD-10-CM | POA: Diagnosis not present

## 2024-04-24 DIAGNOSIS — M6281 Muscle weakness (generalized): Secondary | ICD-10-CM

## 2024-04-24 DIAGNOSIS — R600 Localized edema: Secondary | ICD-10-CM | POA: Diagnosis not present

## 2024-04-24 DIAGNOSIS — R2689 Other abnormalities of gait and mobility: Secondary | ICD-10-CM | POA: Diagnosis not present

## 2024-04-24 DIAGNOSIS — M25561 Pain in right knee: Secondary | ICD-10-CM | POA: Diagnosis not present

## 2024-04-24 NOTE — Therapy (Signed)
 " OUTPATIENT PHYSICAL THERAPY LOWER EXTREMITY TREATMENT   Patient Name: [name] MRN: [id number] DOB:[birth year], 63 y.o., female Today's Date: 04/24/2024  END OF SESSION:  PT End of Session - 04/24/24 0855     Visit Number 2    Number of Visits 20    Date for Recertification  07/02/24    Authorization Type [employer] NO COPAY, DED AND OOP MET    PT Start Time 0847    PT Stop Time 0942    PT Time Calculation (min) 55 min    Equipment Utilized During Treatment Other (comment)   LE strap used for bed mobility   Activity Tolerance Patient tolerated treatment well;Patient limited by pain    Behavior During Therapy WFL for tasks assessed/performed           Past Medical History:  Diagnosis Date   Abnormal Pap smear    gland. cells -> colpo -> neg endo bx, CIN I   Arthritis    Hyperlipidemia    Hypothyroidism    STD (sexually transmitted disease)    HSV   Past Surgical History:  Procedure Laterality Date   CESAREAN SECTION  1996   COLONOSCOPY     GASTRIC BYPASS  08/2003   TOTAL HIP ARTHROPLASTY Left 12/05/2023   Procedure: ARTHROPLASTY, HIP, TOTAL, ANTERIOR APPROACH;  Surgeon: Vernetta Lonni GRADE, MD;  Location: [facility] OR;  Service: Orthopedics;  Laterality: Left;   TOTAL KNEE ARTHROPLASTY Right 04/09/2024   Procedure: ARTHROPLASTY, KNEE, TOTAL;  Surgeon: Vernetta Lonni GRADE, MD;  Location: [facility] OR;  Service: Orthopedics;  Laterality: Right;   VAGINAL DELIVERY  1994   Patient Active Problem List   Diagnosis Date Noted   Status post total right knee replacement 04/09/2024   Status post total replacement of left hip 12/05/2023   Lichen sclerosus 06/04/2019   HSV (herpes simplex virus) infection 11/25/2016   Hypothyroid 11/25/2016   Thyroid  nodule 07/27/2015    PCP: Montie Pizza, MD   REFERRING PROVIDER: Lonni Vernetta, MD   REFERRING DIAG: [phone] (ICD-10-CM) - Status post right knee replacement  THERAPY DIAG:  Acute pain of right knee  Stiffness of  right knee, not elsewhere classified  Muscle weakness (generalized)  Other abnormalities of gait and mobility  Localized edema  Rationale for Evaluation and Treatment: Rehabilitation  ONSET DATE: Rt TKA 04/09/2024  SUBJECTIVE:   SUBJECTIVE STATEMENT: [name] reports good early HEP compliance.  PERTINENT HISTORY: Patient reports that she has always gained and lost weight (up to 100 pounds) throughout her life and never quit walking for physical activity which led to degradation of the Lt hip and Rt knee. Patient underwent Lt THA in August 2025 and then underwent Rt TKA 04/09/2024. Patient notes no other surgery or trauma to joint or surrounding joints. Patient endorses sleep continuing to be impacted and is still sleeping in a recliner/lift chair. Patient notes that MD recently prescribed diclofenac .   See PMH or personal factors for in depth comorbidities    PAIN:  Are you having pain? Yes: NPRS scale: 0/10-7.5/10 this week  Pain location: Rt knee (mostly joint line) Pain description: throbbing, pinches/twinges Aggravating factors: resting/difficult to get comfortable, bending the knee Relieving factors: medications, tylenol  arthritis, ice, mobility   PRECAUTIONS: Fall  RED FLAGS: Bowel or bladder incontinence: Yes: not followed on this, but believes it's from the medication (muscle relaxers) and Cauda equina syndrome: No   WEIGHT BEARING RESTRICTIONS: No  FALLS:  Has patient fallen in last 6 months? No  LIVING  ENVIRONMENT: Lives with: lives with their spouse Lives in: House/apartment Stairs: Yes: Internal: 20 steps; can reach both and External: 4 steps; on left going up Has following equipment at home: Single point cane, Quad cane small base, Walker - 2 wheeled, Environmental Consultant - 4 wheeled, and Crutches  OCCUPATION: work from home, [employer] inpatient coordinator   PLOF: Independent; prior to hip surgery in August she was ambulating with AD (crutches or rollator)   PATIENT GOALS:  walk unassisted   NEXT MD VISIT: 05/20/2024  OBJECTIVE:  Note: Objective measures were completed at Evaluation unless otherwise noted.  DIAGNOSTIC FINDINGS:  IMPRESSION: Right knee arthroplasty without immediate postoperative complication  PATIENT SURVEYS:  PSFS: THE PATIENT SPECIFIC FUNCTIONAL SCALE  Place score of 0-10 (0 = unable to perform activity and 10 = able to perform activity at the same level as before injury or problem)  Activity Date: 04/23/2024    Walking unassisted  2    2. Stairs unassisted  0    3. Physical activity (walking, etc.)  0    4.      Total Score 0.66      Total Score = Sum of activity scores/number of activities  Minimally Detectable Change: 3 points (for single activity); 2 points (for average score)  [name] (nd). The Patient Specific Functional Scale . Retrieved from [email or web address]   COGNITION: Overall cognitive status: Within functional limits for tasks assessed     SENSATION: Subjective numbness of Rt dorsal foot and lower leg secondary to nerve block and edema   POSTURE: rounded shoulders, forward head, and increased thoracic kyphosis  PALPATION: Not TTP around joint lines   LOWER EXTREMITY ROM:  Active ROM Right Eval 04/23/2024 Left Eval 04/23/2024  Hip flexion    Hip extension    Hip abduction    Hip adduction    Hip internal rotation    Hip external rotation    Knee flexion 67deg supine AROM after 3 heel slides   Knee extension 2deg flexion   Ankle dorsiflexion    Ankle plantarflexion    Ankle inversion    Ankle eversion     (Blank rows = not tested)  LOWER EXTREMITY MMT:  MMT Right Eval 04/23/2024 Left Eval 04/23/2024  Hip flexion    Hip extension    Hip abduction    Hip adduction    Hip internal rotation    Hip external rotation    Knee flexion    Knee extension    Ankle dorsiflexion    Ankle plantarflexion    Ankle inversion     Ankle eversion     (Blank rows = not tested)  GAIT: Distance walked: not objectively measured  Assistive device utilized: Walker - 4 wheeled Level of assistance: supervision  Comments: decreased Rt knee flexion with swing, antalgic pattern  TREATMENT DATE:  04/24/2024 Quad sets with right heel prop 2 sets of 10 for 5 seconds Seated active assisted knee flexion (left pushes right into flexion) 10 x 10 seconds Seated TKE (between reps of active assisted knee flexion) 10 x 3 seconds Recumbent bike Seat 7 for 5 minutes active assisted range of motion for 5 minutes  Functional Activities: Double Leg Press 2 sets of 15 reps with 50# full range with slow eccentrics Single Leg Press 10 reps with 25# full range with slow eccentrics Sit to stand with no hands and slow eccentrics 5 x  97535: Discussed emphasis on edema control; quadriceps strength and knee extension and flexion active range of motion at this point in PT  Vaso Right Knee Medium Pressure 34*   04/23/2024 TherEx:  HEP handout provided with patient performing one set of each activity for appropriate form. Verbal and tactile cues performed. PT provided patient with beach ball for supine heel slides with the strap   Self-Care:  POC   PATIENT EDUCATION:  Education details: HEP, POC Person educated: Patient Education method: Explanation, Demonstration, Tactile cues, Verbal cues, and Handouts Education comprehension: verbalized understanding, returned demonstration, verbal cues required, and tactile cues required  HOME EXERCISE PROGRAM: Access Code: [facility] URL: [email or web address] Date: 04/24/2024 Prepared by: Lamar Ivory  Exercises - Supine Heel Slide with Strap  - 1 x daily - 7 x weekly - 3 sets - 10 reps - Supine Knee Extension Stretch on Towel Roll  - 1 x daily - 7 x  weekly - 3 sets - 30sec hold - Quad Setting and Stretching  - 3 x daily - 7 x weekly - 2 sets - 10 reps - 30sec hold - Supine Ankle Pumps in Elevation on Pillows  - 1 x daily - 7 x weekly - 2 sets - 10 reps - Seated Knee Flexion AAROM  - 1 x daily - 7 x weekly - 2 sets - 10 reps - 10 seconds hold - Seated Active Straight-Leg Raise  - 1 x daily - 7 x weekly - 2 sets - 10 reps - 2sec hold  ASSESSMENT:  CLINICAL IMPRESSION: Neala is doing a good job with her early home exercise program compliance.  Quadriceps strength, edema control and flexion active range of motion are an early emphasis while extension active range of motion is excellent.  Continue supervised PT to meet long-term goals.  Patient is a 63 y.o. F who was seen today for physical therapy evaluation and treatment for s/p Rt TKA presenting with motor coordination deficits, strength deficits, functional mobility deficits, ROM deficits, and pain levels. Patient is most limited with knee flexion ROM secondary to pain as well as chronic reliance on ADs for ambulation. Patient will benefit from skilled PT to address above noted deficits.   OBJECTIVE IMPAIRMENTS: decreased activity tolerance, decreased balance, decreased coordination, difficulty walking, decreased ROM, decreased strength, increased edema, increased muscle spasms, impaired sensation, improper body mechanics, postural dysfunction, obesity, and pain.   ACTIVITY LIMITATIONS: carrying, sitting, standing, squatting, sleeping, stairs, transfers, and bed mobility  PARTICIPATION LIMITATIONS: cleaning, driving, shopping, community activity, and occupation  PERSONAL FACTORS: Fitness, Past/current experiences, Time since onset of injury/illness/exacerbation, and 3+ comorbidities: arthritis, HLD, hypothyroidism, body habitus are also affecting patient's functional outcome.   REHAB POTENTIAL: Good  CLINICAL DECISION MAKING: Evolving/moderate complexity  EVALUATION COMPLEXITY:  Moderate   GOALS: Goals reviewed with patient? Yes  SHORT TERM GOALS: Target date: 05/14/2024 Patient will show compliance with initial HEP.  Baseline: Goal  status: Met 04/24/2024  2.  Patient will report pain levels no greater than 6/10 in order to show an improved overall quality of life. Baseline:  Goal status: INITIAL  3.  Patient will increase knee flexion ROM to at least 85deg in order to improve functional mobility. Baseline:  Goal status: INITIAL  4.  Patient will increase PSFS to at least 2.66 in order show a significant improvement in subjective disability rating. Baseline:  Goal status: INITIAL   LONG TERM GOALS: Target date: 07/02/2024  Patient will show independence with final HEP in order to maintain and progress upon functional gains made within PT. Baseline:  Goal status: INITIAL  2.  Patient will report pain levels no greater than 3/10 in order to show an improved overall quality of life. Baseline:  Goal status: INITIAL  3.  Patient will increase PSFS to at least 4.66 in order show a significant improvement in subjective disability rating. Baseline:  Goal status: INITIAL  4.  Patient will improve knee flexion ROM to at least 110deg in order to improve functional mobility. Baseline:  Goal status: INITIAL  5.  Patient will ambulate mod I with LRAD for 100' in order to return to more independence with physical activity. Baseline:  Goal status: INITIAL   PLAN:  PT FREQUENCY: 2x/week  PT DURATION: 10 weeks  PLANNED INTERVENTIONS: 97164- PT Re-evaluation, 97750- Physical Performance Testing, 97110-Therapeutic exercises, 97530- Therapeutic activity, [id number]- Neuromuscular re-education, 97535- Self Care, 02859- Manual therapy, [id number]- Gait training, [phone]- Orthotic Initial, [phone]- Orthotic/Prosthetic subsequent, [phone]- Canalith repositioning, [phone]- Aquatic Therapy, [phone]- Electrical stimulation (unattended), [phone]- Electrical stimulation (manual), [id number]-  Vasopneumatic device, [id number]- Ultrasound, [id number]- Traction (mechanical), [id number]- Ionotophoresis 4mg /ml Dexamethasone , 79439 (1-2 muscles), 20561 (3+ muscles)- Dry Needling, Patient/Family education, Balance training, Stair training, Taping, Joint mobilization, Spinal manipulation, Spinal mobilization, Scar mobilization, Vestibular training, DME instructions, Cryotherapy, and Moist heat  PLAN FOR NEXT SESSION: Review HEP, knee flexion ROM gains, quad strengthening, generalized LE strengthening/endurance, remind patient to schedule out according to frequency/duration plan    Myer LELON Ivory, PT, MPT 04/24/2024 12:38 PM    "

## 2024-04-24 NOTE — Therapy (Signed)
 " OUTPATIENT PHYSICAL THERAPY LOWER EXTREMITY EVALUATION   Patient Name: [name] MRN: [id number] DOB:[birth year], 63 y.o., female Today's Date: 04/24/2024  END OF SESSION:  PT End of Session - 04/23/24 1313     Visit Number 1    Number of Visits 20    Date for Recertification  07/02/24    Authorization Type [employer] NO COPAY, DED AND OOP MET    PT Start Time 1310    PT Stop Time 1347    PT Time Calculation (min) 37 min    Equipment Utilized During Treatment Other (comment)   LE strap used for bed mobility   Activity Tolerance Patient tolerated treatment well;Patient limited by pain    Behavior During Therapy WFL for tasks assessed/performed          Past Medical History:  Diagnosis Date   Abnormal Pap smear    gland. cells -> colpo -> neg endo bx, CIN I   Arthritis    Hyperlipidemia    Hypothyroidism    STD (sexually transmitted disease)    HSV   Past Surgical History:  Procedure Laterality Date   CESAREAN SECTION  1996   COLONOSCOPY     GASTRIC BYPASS  08/2003   TOTAL HIP ARTHROPLASTY Left 12/05/2023   Procedure: ARTHROPLASTY, HIP, TOTAL, ANTERIOR APPROACH;  Surgeon: Vernetta Lonni GRADE, MD;  Location: [facility] OR;  Service: Orthopedics;  Laterality: Left;   TOTAL KNEE ARTHROPLASTY Right 04/09/2024   Procedure: ARTHROPLASTY, KNEE, TOTAL;  Surgeon: Vernetta Lonni GRADE, MD;  Location: [facility] OR;  Service: Orthopedics;  Laterality: Right;   VAGINAL DELIVERY  1994   Patient Active Problem List   Diagnosis Date Noted   Status post total right knee replacement 04/09/2024   Status post total replacement of left hip 12/05/2023   Lichen sclerosus 06/04/2019   HSV (herpes simplex virus) infection 11/25/2016   Hypothyroid 11/25/2016   Thyroid  nodule 07/27/2015    PCP: Montie Pizza, MD   REFERRING PROVIDER: Lonni Vernetta, MD   REFERRING DIAG: [phone] (ICD-10-CM) - Status post right knee replacement  THERAPY DIAG:  Acute pain of right knee - Plan: PT plan of  care cert/re-cert  Stiffness of right knee, not elsewhere classified - Plan: PT plan of care cert/re-cert  Muscle weakness (generalized) - Plan: PT plan of care cert/re-cert  Other abnormalities of gait and mobility - Plan: PT plan of care cert/re-cert  Localized edema - Plan: PT plan of care cert/re-cert  Rationale for Evaluation and Treatment: Rehabilitation  ONSET DATE: Rt TKA 04/09/2024  SUBJECTIVE:   SUBJECTIVE STATEMENT: I think I'm doing good.   PERTINENT HISTORY: Patient reports that she has always gained and lost weight (up to 100 pounds) throughout her life and never quit walking for physical activity which led to degradation of the Lt hip and Rt knee. Patient underwent Lt THA in August 2025 and then underwent Rt TKA 04/09/2024. Patient notes no other surgery or trauma to joint or surrounding joints. Patient endorses sleep continuing to be impacted and is still sleeping in a recliner/lift chair. Patient notes that MD recently prescribed diclofenac .   See PMH or personal factors for in depth comorbidities    PAIN:  Are you having pain? Yes: NPRS scale: 0/10 at rest, 7.5/10 at worst  Pain location: Rt knee (mostly joint line) Pain description: throbbing, pinches/twinges Aggravating factors: resting/difficult to get comfortable, bending the knee Relieving factors: medications, tylenol  arthritis, ice, mobility   PRECAUTIONS: Fall  RED FLAGS: Bowel or bladder incontinence:  Yes: not followed on this, but believes it's from the medication (muscle relaxers) and Cauda equina syndrome: No   WEIGHT BEARING RESTRICTIONS: No  FALLS:  Has patient fallen in last 6 months? No  LIVING ENVIRONMENT: Lives with: lives with their spouse Lives in: House/apartment Stairs: Yes: Internal: 20 steps; can reach both and External: 4 steps; on left going up Has following equipment at home: Single point cane, Quad cane small base, Walker - 2 wheeled, Environmental Consultant - 4 wheeled, and  Crutches  OCCUPATION: work from home, [employer] inpatient coordinator   PLOF: Independent; prior to hip surgery in August she was ambulating with AD (crutches or rollator)   PATIENT GOALS: walk unassisted   NEXT MD VISIT: 05/20/2024  OBJECTIVE:  Note: Objective measures were completed at Evaluation unless otherwise noted.  DIAGNOSTIC FINDINGS:  IMPRESSION: Right knee arthroplasty without immediate postoperative complication  PATIENT SURVEYS:  PSFS: THE PATIENT SPECIFIC FUNCTIONAL SCALE  Place score of 0-10 (0 = unable to perform activity and 10 = able to perform activity at the same level as before injury or problem)  Activity Date: 04/23/2024    Walking unassisted  2    2. Stairs unassisted  0    3. Physical activity (walking, etc.)  0    4.      Total Score 0.66      Total Score = Sum of activity scores/number of activities  Minimally Detectable Change: 3 points (for single activity); 2 points (for average score)  [facility] (nd). The Patient Specific Functional Scale . Retrieved from [email or web address]   COGNITION: Overall cognitive status: Within functional limits for tasks assessed     SENSATION: Subjective numbness of Rt dorsal foot and lower leg secondary to nerve block and edema   POSTURE: rounded shoulders, forward head, and increased thoracic kyphosis  PALPATION: Not TTP around joint lines   LOWER EXTREMITY ROM:  Active ROM Right Eval 04/23/2024 Left Eval 04/23/2024  Hip flexion    Hip extension    Hip abduction    Hip adduction    Hip internal rotation    Hip external rotation    Knee flexion 67deg supine AROM after 3 heel slides   Knee extension 2deg flexion   Ankle dorsiflexion    Ankle plantarflexion    Ankle inversion    Ankle eversion     (Blank rows = not tested)  LOWER EXTREMITY MMT:  MMT Right Eval 04/23/2024 Left Eval 04/23/2024  Hip flexion    Hip extension     Hip abduction    Hip adduction    Hip internal rotation    Hip external rotation    Knee flexion    Knee extension    Ankle dorsiflexion    Ankle plantarflexion    Ankle inversion    Ankle eversion     (Blank rows = not tested)  GAIT: Distance walked: not objectively measured  Assistive device utilized: Walker - 4 wheeled Level of assistance: supervision  Comments: decreased Rt knee flexion with swing, antalgic pattern  TREATMENT DATE:  04/23/2024 TherEx:  HEP handout provided with patient performing one set of each activity for appropriate form. Verbal and tactile cues performed. PT provided patient with beach ball for supine heel slides with the strap   Self-Care:  POC    PATIENT EDUCATION:  Education details: HEP, POC Person educated: Patient Education method: Explanation, Demonstration, Tactile cues, Verbal cues, and Handouts Education comprehension: verbalized understanding, returned demonstration, verbal cues required, and tactile cues required  HOME EXERCISE PROGRAM: Access Code: [facility] URL: [email or web address] Date: 04/23/2024 Prepared by: Susannah Daring  Exercises - Supine Heel Slide with Strap  - 1 x daily - 7 x weekly - 3 sets - 10 reps - Supine Knee Extension Stretch on Towel Roll  - 1 x daily - 7 x weekly - 3 sets - 30sec hold - Quad Setting and Stretching  - 1 x daily - 7 x weekly - 2 sets - 10 reps - 30sec hold - Supine Ankle Pumps in Elevation on Pillows  - 1 x daily - 7 x weekly - 2 sets - 10 reps - Seated Knee Flexion AAROM  - 1 x daily - 7 x weekly - 2 sets - 10 reps - Seated Active Straight-Leg Raise  - 1 x daily - 7 x weekly - 2 sets - 10 reps - 2sec hold  ASSESSMENT:  CLINICAL IMPRESSION: Patient is a 63 y.o. F who was seen today for physical therapy evaluation and treatment for s/p Rt TKA presenting  with motor coordination deficits, strength deficits, functional mobility deficits, ROM deficits, and pain levels. Patient is most limited with knee flexion ROM secondary to pain as well as chronic reliance on ADs for ambulation. Patient will benefit from skilled PT to address above noted deficits.   OBJECTIVE IMPAIRMENTS: decreased activity tolerance, decreased balance, decreased coordination, difficulty walking, decreased ROM, decreased strength, increased edema, increased muscle spasms, impaired sensation, improper body mechanics, postural dysfunction, obesity, and pain.   ACTIVITY LIMITATIONS: carrying, sitting, standing, squatting, sleeping, stairs, transfers, and bed mobility  PARTICIPATION LIMITATIONS: cleaning, driving, shopping, community activity, and occupation  PERSONAL FACTORS: Fitness, Past/current experiences, Time since onset of injury/illness/exacerbation, and 3+ comorbidities: arthritis, HLD, hypothyroidism, body habitus are also affecting patient's functional outcome.   REHAB POTENTIAL: Good  CLINICAL DECISION MAKING: Evolving/moderate complexity  EVALUATION COMPLEXITY: Moderate   GOALS: Goals reviewed with patient? Yes  SHORT TERM GOALS: Target date: 05/14/2024 Patient will show compliance with initial HEP.  Baseline: Goal status: INITIAL  2.  Patient will report pain levels no greater than 6/10 in order to show an improved overall quality of life. Baseline:  Goal status: INITIAL  3.  Patient will increase knee flexion ROM to at least 85deg in order to improve functional mobility. Baseline:  Goal status: INITIAL  4.  Patient will increase PSFS to at least 2.66 in order show a significant improvement in subjective disability rating. Baseline:  Goal status: INITIAL   LONG TERM GOALS: Target date: 07/02/2024  Patient will show independence with final HEP in order to maintain and progress upon functional gains made within PT. Baseline:  Goal status: INITIAL  2.   Patient will report pain levels no greater than 3/10 in order to show an improved overall quality of life. Baseline:  Goal status: INITIAL  3.  Patient will increase PSFS to at least 4.66 in order show a significant improvement in subjective disability rating. Baseline:  Goal status: INITIAL  4.  Patient will improve knee flexion ROM to at least  110deg in order to improve functional mobility. Baseline:  Goal status: INITIAL  5.  Patient will ambulate mod I with LRAD for 100' in order to return to more independence with physical activity. Baseline:  Goal status: INITIAL   PLAN:  PT FREQUENCY: 2x/week  PT DURATION: 10 weeks  PLANNED INTERVENTIONS: 97164- PT Re-evaluation, 97750- Physical Performance Testing, 97110-Therapeutic exercises, 97530- Therapeutic activity, [id number]- Neuromuscular re-education, 97535- Self Care, 02859- Manual therapy, [id number]- Gait training, [phone]- Orthotic Initial, [phone]- Orthotic/Prosthetic subsequent, [phone]- Canalith repositioning, [phone]- Aquatic Therapy, [phone]- Electrical stimulation (unattended), [phone]- Electrical stimulation (manual), [id number]- Vasopneumatic device, [id number]- Ultrasound, [id number]- Traction (mechanical), [id number]- Ionotophoresis 4mg /ml Dexamethasone , 79439 (1-2 muscles), 20561 (3+ muscles)- Dry Needling, Patient/Family education, Balance training, Stair training, Taping, Joint mobilization, Spinal manipulation, Spinal mobilization, Scar mobilization, Vestibular training, DME instructions, Cryotherapy, and Moist heat  PLAN FOR NEXT SESSION: review HEP, knee flexion ROM gains, quad strengthening, generalized LE strengthening/endurance, remind patient to schedule out according to frequency/duration plan    Susannah Daring, PT, DPT 04/24/2024 8:50 AM    "

## 2024-04-29 NOTE — Therapy (Signed)
 " OUTPATIENT PHYSICAL THERAPY LOWER EXTREMITY TREATMENT   Patient Name: ANAIH BRANDER MRN: [id number] DOB:[birth year], 63 y.o., female Today's Date: 05/01/2024  END OF SESSION:  PT End of Session - 05/01/24 1043     Visit Number 3    Number of Visits 20    Date for Recertification  07/02/24    PT Start Time 0847    PT Stop Time 0935    PT Time Calculation (min) 48 min    Activity Tolerance Patient tolerated treatment well    Behavior During Therapy [facility] for tasks assessed/performed            Past Medical History:  Diagnosis Date   Abnormal Pap smear    gland. cells -> colpo -> neg endo bx, CIN I   Arthritis    Hyperlipidemia    Hypothyroidism    STD (sexually transmitted disease)    HSV   Past Surgical History:  Procedure Laterality Date   CESAREAN SECTION  1996   COLONOSCOPY     GASTRIC BYPASS  08/2003   TOTAL HIP ARTHROPLASTY Left 12/05/2023   Procedure: ARTHROPLASTY, HIP, TOTAL, ANTERIOR APPROACH;  Surgeon: Vernetta Lonni GRADE, MD;  Location: [facility] OR;  Service: Orthopedics;  Laterality: Left;   TOTAL KNEE ARTHROPLASTY Right 04/09/2024   Procedure: ARTHROPLASTY, KNEE, TOTAL;  Surgeon: Vernetta Lonni GRADE, MD;  Location: [facility] OR;  Service: Orthopedics;  Laterality: Right;   VAGINAL DELIVERY  1994   Patient Active Problem List   Diagnosis Date Noted   Status post total right knee replacement 04/09/2024   Status post total replacement of left hip 12/05/2023   Lichen sclerosus 06/04/2019   HSV (herpes simplex virus) infection 11/25/2016   Hypothyroid 11/25/2016   Thyroid  nodule 07/27/2015    PCP: Montie Pizza, MD   REFERRING PROVIDER: Lonni Vernetta, MD   REFERRING DIAG: [phone] (ICD-10-CM) - Status post right knee replacement  THERAPY DIAG:  Acute pain of right knee  Stiffness of right knee, not elsewhere classified  Muscle weakness (generalized)  Other abnormalities of gait and mobility  Localized edema  Rationale for Evaluation and  Treatment: Rehabilitation  ONSET DATE: Rt TKA 04/09/2024  SUBJECTIVE:   SUBJECTIVE STATEMENT: Pt reports she has been working on bending since last visit.  PERTINENT HISTORY: Patient reports that she has always gained and lost weight (up to 100 pounds) throughout her life and never quit walking for physical activity which led to degradation of the Lt hip and Rt knee. Patient underwent Lt THA in August 2025 and then underwent Rt TKA 04/09/2024. Patient notes no other surgery or trauma to joint or surrounding joints. Patient endorses sleep continuing to be impacted and is still sleeping in a recliner/lift chair. Patient notes that MD recently prescribed diclofenac .   See PMH or personal factors for in depth comorbidities    PAIN:  Are you having pain? Yes: NPRS scale: 3/10 Pain location: Rt knee (mostly joint line) Pain description: throbbing, pinches/twinges Aggravating factors: resting/difficult to get comfortable, bending the knee Relieving factors: medications, tylenol  arthritis, ice, mobility   PRECAUTIONS: Fall  RED FLAGS: Bowel or bladder incontinence: Yes: not followed on this, but believes it's from the medication (muscle relaxers) and Cauda equina syndrome: No   WEIGHT BEARING RESTRICTIONS: No  FALLS:  Has patient fallen in last 6 months? No  LIVING ENVIRONMENT: Lives with: lives with their spouse Lives in: House/apartment Stairs: Yes: Internal: 20 steps; can reach both and External: 4 steps; on left going up Has following  equipment at home: Single point cane, Quad cane small base, Walker - 2 wheeled, Environmental Consultant - 4 wheeled, and Crutches  OCCUPATION: work from home, [employer] inpatient coordinator   PLOF: Independent; prior to hip surgery in August she was ambulating with AD (crutches or rollator)   PATIENT GOALS: walk unassisted   NEXT MD VISIT: 05/20/2024  OBJECTIVE:  Note: Objective measures were completed at Evaluation unless otherwise noted.  DIAGNOSTIC FINDINGS:   IMPRESSION: Right knee arthroplasty without immediate postoperative complication  PATIENT SURVEYS:  PSFS: THE PATIENT SPECIFIC FUNCTIONAL SCALE  Place score of 0-10 (0 = unable to perform activity and 10 = able to perform activity at the same level as before injury or problem)  Activity Date: 04/23/2024    Walking unassisted  2    2. Stairs unassisted  0    3. Physical activity (walking, etc.)  0    4.      Total Score 0.66      Total Score = Sum of activity scores/number of activities  Minimally Detectable Change: 3 points (for single activity); 2 points (for average score)  [employer] [facility] (nd). The Patient Specific Functional Scale . Retrieved from [email or web address]   COGNITION: Overall cognitive status: Within functional limits for tasks assessed     SENSATION: Subjective numbness of Rt dorsal foot and lower leg secondary to nerve block and edema   POSTURE: rounded shoulders, forward head, and increased thoracic kyphosis  PALPATION: Not TTP around joint lines   LOWER EXTREMITY ROM:  Active ROM Right Eval 04/23/2024 Left Eval 04/23/2024 Right 05/01/24  Hip flexion     Hip extension     Hip abduction     Hip adduction     Hip internal rotation     Hip external rotation     Knee flexion 67deg supine AROM after 3 heel slides  Supine 80A   Knee extension 2deg flexion    Ankle dorsiflexion     Ankle plantarflexion     Ankle inversion     Ankle eversion      (Blank rows = not tested)  LOWER EXTREMITY MMT:  MMT Right Eval 04/23/2024 Left Eval 04/23/2024  Hip flexion    Hip extension    Hip abduction    Hip adduction    Hip internal rotation    Hip external rotation    Knee flexion    Knee extension    Ankle dorsiflexion    Ankle plantarflexion    Ankle inversion    Ankle eversion     (Blank rows = not tested)  GAIT: Distance walked: not objectively measured  Assistive device  utilized: Walker - 4 wheeled Level of assistance: supervision  Comments: decreased Rt knee flexion with swing, antalgic pattern                                                                                                                                TREATMENT  DATE: R TKA 05/01/24 Nustep seat 8 level 4 5 min Double Leg Press 2 sets of 15 reps with 56# full range with slow eccentrics Single Leg Press 2x10 reps with 31# full range with slow eccentrics Sit to stand from 23.5 inches 6x no UE use Seated knee flexion/heel slides 2x10 Seated SLR 2x10 Seated LAQ 2x10 Supine heel slides 15x Manual: PROM for flexion mobility Vaso Right Knee Medium Pressure 34* 10 min  04/24/2024 Quad sets with right heel prop 2 sets of 10 for 5 seconds Seated active assisted knee flexion (left pushes right into flexion) 10 x 10 seconds Seated TKE (between reps of active assisted knee flexion) 10 x 3 seconds Recumbent bike Seat 7 for 5 minutes active assisted range of motion for 5 minutes  Functional Activities: Double Leg Press 2 sets of 15 reps with 50# full range with slow eccentrics Single Leg Press 10 reps with 25# full range with slow eccentrics Sit to stand with no hands and slow eccentrics 5 x  97535: Discussed emphasis on edema control; quadriceps strength and knee extension and flexion active range of motion at this point in PT  Vaso Right Knee Medium Pressure 34*   04/23/2024 TherEx:  HEP handout provided with patient performing one set of each activity for appropriate form. Verbal and tactile cues performed. PT provided patient with beach ball for supine heel slides with the strap   Self-Care:  POC   PATIENT EDUCATION:  Education details: HEP, POC Person educated: Patient Education method: Explanation, Demonstration, Tactile cues, Verbal cues, and Handouts Education comprehension: verbalized understanding, returned demonstration, verbal cues required, and tactile cues  required  HOME EXERCISE PROGRAM: Access Code: [facility] URL: [email or web address] Date: 04/24/2024 Prepared by: Lamar Ivory  Exercises - Supine Heel Slide with Strap  - 1 x daily - 7 x weekly - 3 sets - 10 reps - Supine Knee Extension Stretch on Towel Roll  - 1 x daily - 7 x weekly - 3 sets - 30sec hold - Quad Setting and Stretching  - 3 x daily - 7 x weekly - 2 sets - 10 reps - 30sec hold - Supine Ankle Pumps in Elevation on Pillows  - 1 x daily - 7 x weekly - 2 sets - 10 reps - Seated Knee Flexion AAROM  - 1 x daily - 7 x weekly - 2 sets - 10 reps - 10 seconds hold - Seated Active Straight-Leg Raise  - 1 x daily - 7 x weekly - 2 sets - 10 reps - 2sec hold  ASSESSMENT:  CLINICAL IMPRESSION:  Pt needed VC for exercise form and sit to stand form.  Unable to sit to syand < 23 inch height.  OBJECTIVE IMPAIRMENTS: decreased activity tolerance, decreased balance, decreased coordination, difficulty walking, decreased ROM, decreased strength, increased edema, increased muscle spasms, impaired sensation, improper body mechanics, postural dysfunction, obesity, and pain.   ACTIVITY LIMITATIONS: carrying, sitting, standing, squatting, sleeping, stairs, transfers, and bed mobility  PARTICIPATION LIMITATIONS: cleaning, driving, shopping, community activity, and occupation  PERSONAL FACTORS: Fitness, Past/current experiences, Time since onset of injury/illness/exacerbation, and 3+ comorbidities: arthritis, HLD, hypothyroidism, body habitus are also affecting patient's functional outcome.   REHAB POTENTIAL: Good  CLINICAL DECISION MAKING: Evolving/moderate complexity  EVALUATION COMPLEXITY: Moderate   GOALS: Goals reviewed with patient? Yes  SHORT TERM GOALS: Target date: 05/14/2024 Patient will show compliance with initial HEP.  Baseline: Goal status: Met 04/24/2024  2.  Patient will report pain levels no greater than 6/10 in order  to show an improved overall quality of  life. Baseline:  Goal status: INITIAL  3.  Patient will increase knee flexion ROM to at least 85deg in order to improve functional mobility. Baseline:  Goal status: INITIAL  4.  Patient will increase PSFS to at least 2.66 in order show a significant improvement in subjective disability rating. Baseline:  Goal status: INITIAL   LONG TERM GOALS: Target date: 07/02/2024  Patient will show independence with final HEP in order to maintain and progress upon functional gains made within PT. Baseline:  Goal status: INITIAL  2.  Patient will report pain levels no greater than 3/10 in order to show an improved overall quality of life. Baseline:  Goal status: INITIAL  3.  Patient will increase PSFS to at least 4.66 in order show a significant improvement in subjective disability rating. Baseline:  Goal status: INITIAL  4.  Patient will improve knee flexion ROM to at least 110deg in order to improve functional mobility. Baseline:  Goal status: INITIAL  5.  Patient will ambulate mod I with LRAD for 100' in order to return to more independence with physical activity. Baseline:  Goal status: INITIAL   PLAN:  PT FREQUENCY: 2x/week  PT DURATION: 10 weeks  PLANNED INTERVENTIONS: 97164- PT Re-evaluation, 97750- Physical Performance Testing, 97110-Therapeutic exercises, 97530- Therapeutic activity, [id number]- Neuromuscular re-education, 97535- Self Care, 02859- Manual therapy, [id number]- Gait training, [phone]- Orthotic Initial, [phone]- Orthotic/Prosthetic subsequent, [phone]- Canalith repositioning, [id number]- Aquatic Therapy, [phone]- Electrical stimulation (unattended), [phone]- Electrical stimulation (manual), [id number]- Vasopneumatic device, [id number]- Ultrasound, [id number]- Traction (mechanical), [id number]- Ionotophoresis 4mg /ml Dexamethasone , 79439 (1-2 muscles), 20561 (3+ muscles)- Dry Needling, Patient/Family education, Balance training, Stair training, Taping, Joint mobilization, Spinal manipulation, Spinal mobilization,  Scar mobilization, Vestibular training, DME instructions, Cryotherapy, and Moist heat  PLAN FOR NEXT SESSION:  knee flexion ROM gains, quad strengthening, generalized LE strengthening/endurance, remind patient to schedule out according to frequency/duration plan   Burnard Meth, PT 05/01/2024  10:44 AM     "

## 2024-04-30 ENCOUNTER — Encounter: Admitting: Rehabilitative and Restorative Service Providers"

## 2024-04-30 ENCOUNTER — Telehealth: Payer: Self-pay | Admitting: Rehabilitative and Restorative Service Providers"

## 2024-04-30 NOTE — Telephone Encounter (Signed)
 Left a VM with reminder of her appointment tomorrow at 8:45 AM.  Left [phone] number to call and reschedule if needed.

## 2024-05-01 ENCOUNTER — Ambulatory Visit

## 2024-05-01 ENCOUNTER — Telehealth: Payer: Self-pay | Admitting: Orthopaedic Surgery

## 2024-05-01 DIAGNOSIS — R2689 Other abnormalities of gait and mobility: Secondary | ICD-10-CM

## 2024-05-01 DIAGNOSIS — M6281 Muscle weakness (generalized): Secondary | ICD-10-CM

## 2024-05-01 DIAGNOSIS — R600 Localized edema: Secondary | ICD-10-CM | POA: Diagnosis not present

## 2024-05-01 DIAGNOSIS — M25561 Pain in right knee: Secondary | ICD-10-CM | POA: Diagnosis not present

## 2024-05-01 DIAGNOSIS — M25661 Stiffness of right knee, not elsewhere classified: Secondary | ICD-10-CM

## 2024-05-01 NOTE — Telephone Encounter (Signed)
 Pt dropped off her Short Term Disability form to be completed by the provider.  No Form Fees were collected.    Placed in the tray for Trisha to process.

## 2024-05-01 NOTE — Telephone Encounter (Signed)
 Placed on Tammy's desk per [facility]

## 2024-05-02 NOTE — Telephone Encounter (Signed)
 Received

## 2024-05-07 ENCOUNTER — Encounter: Payer: Self-pay | Admitting: Physical Therapy

## 2024-05-07 ENCOUNTER — Ambulatory Visit (INDEPENDENT_AMBULATORY_CARE_PROVIDER_SITE_OTHER): Admitting: Physical Therapy

## 2024-05-07 DIAGNOSIS — R600 Localized edema: Secondary | ICD-10-CM | POA: Diagnosis not present

## 2024-05-07 DIAGNOSIS — R2689 Other abnormalities of gait and mobility: Secondary | ICD-10-CM

## 2024-05-07 DIAGNOSIS — M6281 Muscle weakness (generalized): Secondary | ICD-10-CM | POA: Diagnosis not present

## 2024-05-07 DIAGNOSIS — M25661 Stiffness of right knee, not elsewhere classified: Secondary | ICD-10-CM

## 2024-05-07 DIAGNOSIS — M25561 Pain in right knee: Secondary | ICD-10-CM

## 2024-05-07 NOTE — Therapy (Signed)
 " OUTPATIENT PHYSICAL THERAPY LOWER EXTREMITY TREATMENT   Patient Name: [name] MRN: [id number] DOB:December 28, 1960, 64 y.o., female Today's Date: 05/07/2024  END OF SESSION:  PT End of Session - 05/07/24 1432     Visit Number 4    Number of Visits 20    Date for Recertification  07/02/24    Authorization Type [employer] NO COPAY, DED AND OOP MET    PT Start Time 1432    PT Stop Time 1537    PT Time Calculation (min) 65 min    Activity Tolerance Patient tolerated treatment well    Behavior During Therapy WFL for tasks assessed/performed             Past Medical History:  Diagnosis Date   Abnormal Pap smear    gland. cells -> colpo -> neg endo bx, CIN I   Arthritis    Hyperlipidemia    Hypothyroidism    STD (sexually transmitted disease)    HSV   Past Surgical History:  Procedure Laterality Date   CESAREAN SECTION  1996   COLONOSCOPY     GASTRIC BYPASS  08/2003   TOTAL HIP ARTHROPLASTY Left 12/05/2023   Procedure: ARTHROPLASTY, HIP, TOTAL, ANTERIOR APPROACH;  Surgeon: Holmes Jenna GRADE, MD;  Location: [facility] OR;  Service: Orthopedics;  Laterality: Left;   TOTAL KNEE ARTHROPLASTY Right 04/09/2024   Procedure: ARTHROPLASTY, KNEE, TOTAL;  Surgeon: Holmes Jenna GRADE, MD;  Location: [facility] OR;  Service: Orthopedics;  Laterality: Right;   VAGINAL DELIVERY  1994   Patient Active Problem List   Diagnosis Date Noted   Status post total right knee replacement 04/09/2024   Status post total replacement of left hip 12/05/2023   Lichen sclerosus 06/04/2019   HSV (herpes simplex virus) infection 11/25/2016   Hypothyroid 11/25/2016   Thyroid  nodule 07/27/2015    PCP: Jenna Pizza, MD   REFERRING PROVIDER: Lonni Vernetta, MD   REFERRING DIAG: [phone] (ICD-10-CM) - Status post right knee replacement  THERAPY DIAG:  Acute pain of right knee  Stiffness of right knee, not elsewhere classified  Muscle weakness (generalized)  Other abnormalities of gait and  mobility  Localized edema  Rationale for Evaluation and Treatment: Rehabilitation  ONSET DATE: Rt TKA 04/09/2024  SUBJECTIVE:   SUBJECTIVE STATEMENT: She is on her bike only able to do flexion stretch. She also does her exercises as much as possible to bend the knee.    PERTINENT HISTORY: Patient reports that she has always gained and lost weight (up to 100 pounds) throughout her life and never quit walking for physical activity which led to degradation of the Lt hip and Rt knee. Patient underwent Lt THA in August 2025 and then underwent Rt TKA 04/09/2024. Patient notes no other surgery or trauma to joint or surrounding joints. Patient endorses sleep continuing to be impacted and is still sleeping in a recliner/lift chair. Patient notes that MD recently prescribed diclofenac .   See PMH or personal factors for in depth comorbidities    PAIN:  Are you having pain? Yes: NPRS scale: since last PT lowest 0/10 worst 6.5/10 Pain location: Rt knee (mostly joint line) Pain description: throbbing, pinches/twinges Aggravating factors: resting/difficult to get comfortable, bending the knee Relieving factors: medications, tylenol  arthritis, ice, mobility   PRECAUTIONS: Fall  RED FLAGS: Bowel or bladder incontinence: Yes: not followed on this, but believes it's from the medication (muscle relaxers) and Cauda equina syndrome: No   WEIGHT BEARING RESTRICTIONS: No  FALLS:  Has patient fallen in  last 6 months? No  LIVING ENVIRONMENT: Lives with: lives with their spouse Lives in: House/apartment Stairs: Yes: Internal: 20 steps; can reach both and External: 4 steps; on left going up Has following equipment at home: Single point cane, Quad cane small base, Walker - 2 wheeled, Environmental Consultant - 4 wheeled, and Crutches  OCCUPATION: work from home, [employer] inpatient coordinator   PLOF: Independent; prior to hip surgery in August she was ambulating with AD (crutches or rollator)   PATIENT GOALS: walk  unassisted   NEXT MD VISIT: 05/20/2024  OBJECTIVE:  Note: Objective measures were completed at Evaluation unless otherwise noted.  DIAGNOSTIC FINDINGS:  IMPRESSION: Right knee arthroplasty without immediate postoperative complication  PATIENT SURVEYS:  PSFS: THE PATIENT SPECIFIC FUNCTIONAL SCALE  Place score of 0-10 (0 = unable to perform activity and 10 = able to perform activity at the same level as before injury or problem)  Activity Date: 04/23/2024    Walking unassisted  2    2. Stairs unassisted  0    3. Physical activity (walking, etc.)  0    4.      Total Score 0.66      Total Score = Sum of activity scores/number of activities  Minimally Detectable Change: 3 points (for single activity); 2 points (for average score)  [facility] (nd). The Patient Specific Functional Scale . Retrieved from [email or web address]   COGNITION: Overall cognitive status: Within functional limits for tasks assessed     SENSATION: Subjective numbness of Rt dorsal foot and lower leg secondary to nerve block and edema   POSTURE: rounded shoulders, forward head, and increased thoracic kyphosis  PALPATION: Not TTP around joint lines   LOWER EXTREMITY ROM:  Active ROM Right Eval 04/23/2024 Right 05/01/24 Right 05/07/24  Hip flexion     Hip extension     Hip abduction     Hip adduction     Hip internal rotation     Hip external rotation     Knee flexion 67deg supine AROM after 3 heel slides Supine 80A  Seated AA: 100*  Knee extension 2deg flexion    Ankle dorsiflexion     Ankle plantarflexion     Ankle inversion     Ankle eversion      (Blank rows = not tested)  LOWER EXTREMITY MMT:  MMT Right Eval 04/23/2024 Left Eval 04/23/2024  Hip flexion    Hip extension    Hip abduction    Hip adduction    Hip internal rotation    Hip external rotation    Knee flexion    Knee extension    Ankle dorsiflexion     Ankle plantarflexion    Ankle inversion    Ankle eversion     (Blank rows = not tested)  GAIT: Distance walked: not objectively measured  Assistive device utilized: Environmental Consultant - 4 wheeled Level of assistance: supervision  Comments: decreased Rt knee flexion with swing, antalgic pattern   TREATMENT DATE: R TKA 05/07/2024 Therapeutic Exercise: SciFit bike seat 11 for 4 min with BLEs & BUEs, then BLEs only 4 min.   Knee flexion stretch with foot on 18 chair against wall (chair back & cane support) 3 reps 10 sec hold 3 sets moving LLE stance 1-2 closer to chair.   Therapeutic Activities: PT demo & verbal cues on sit to/from 18 chair. 10 reps using armrests then 3 reps using UEs on seat / no armrests.  PT demo & verbal cues on technique to  use RLE for step negotiation. BUE support with 6 box -Pt able to actively flex knee to step up, step up and step down for 10 reps. Pt improved knee control & utilization of Rt knee with repetition.    Gait Training: Pt arrived to PT amb with cane.  PT tighten lock ring on her cane for safety.  Pt amb within clinic without AD with supervision 20' - 50'.   Manual:  Soft tissue mobilization with suction cup and instrument assisted.  Contract-relax and AAROM knee flexion with overpressure / tibial internal rotation.    Vaso Right Knee Medium Pressure 34* 10 min with extension prop.                                                                                                                              TREATMENT DATE: R TKA 05/01/24 Nustep seat 8 level 4 5 min Double Leg Press 2 sets of 15 reps with 56# full range with slow eccentrics Single Leg Press 2x10 reps with 31# full range with slow eccentrics Sit to stand from 23.5 inches 6x no UE use Seated knee flexion/heel slides 2x10 Seated SLR 2x10 Seated LAQ 2x10 Supine heel slides 15x Manual: PROM for flexion mobility Vaso Right Knee Medium Pressure 34* 10 min  04/24/2024 Quad sets with  right heel prop 2 sets of 10 for 5 seconds Seated active assisted knee flexion (left pushes right into flexion) 10 x 10 seconds Seated TKE (between reps of active assisted knee flexion) 10 x 3 seconds Recumbent bike Seat 7 for 5 minutes active assisted range of motion for 5 minutes  Functional Activities: Double Leg Press 2 sets of 15 reps with 50# full range with slow eccentrics Single Leg Press 10 reps with 25# full range with slow eccentrics Sit to stand with no hands and slow eccentrics 5 x  [id number]: Discussed emphasis on edema control; quadriceps strength and knee extension and flexion active range of motion at this point in PT  Vaso Right Knee Medium Pressure 34*   04/23/2024 TherEx:  HEP handout provided with patient performing one set of each activity for appropriate form. Verbal and tactile cues performed. PT provided patient with beach ball for supine heel slides with the strap   Self-Care:  POC   PATIENT EDUCATION:  Education details: HEP, POC Person educated: Patient Education method: Explanation, Demonstration, Tactile cues, Verbal cues, and Handouts Education comprehension: verbalized understanding, returned demonstration, verbal cues required, and tactile cues required  HOME EXERCISE PROGRAM: Access Code: [facility] URL: [email or web address] Date: 04/24/2024 Prepared by: Lamar Ivory  Exercises - Supine Heel Slide with Strap  - 1 x daily - 7 x weekly - 3 sets - 10 reps - Supine Knee Extension Stretch on Towel Roll  - 1 x daily - 7 x weekly - 3 sets - 30sec hold - Quad Setting and Stretching  - 3 x daily - 7 x weekly - 2 sets - 10 reps -  30sec hold - Supine Ankle Pumps in Elevation on Pillows  - 1 x daily - 7 x weekly - 2 sets - 10 reps - Seated Knee Flexion AAROM  - 1 x daily - 7 x weekly - 2 sets - 10 reps - 10 seconds hold - Seated Active Straight-Leg Raise  - 1 x daily - 7 x weekly - 2 sets - 10 reps - 2sec hold  ASSESSMENT:  CLINICAL  IMPRESSION:  Patient improved knee range with progressive exercises, activities and manual therapy.  She improved her ability to ambulate without device with no knee instability.   OBJECTIVE IMPAIRMENTS: decreased activity tolerance, decreased balance, decreased coordination, difficulty walking, decreased ROM, decreased strength, increased edema, increased muscle spasms, impaired sensation, improper body mechanics, postural dysfunction, obesity, and pain.   ACTIVITY LIMITATIONS: carrying, sitting, standing, squatting, sleeping, stairs, transfers, and bed mobility  PARTICIPATION LIMITATIONS: cleaning, driving, shopping, community activity, and occupation  PERSONAL FACTORS: Fitness, Past/current experiences, Time since onset of injury/illness/exacerbation, and 3+ comorbidities: arthritis, HLD, hypothyroidism, body habitus are also affecting patient's functional outcome.   REHAB POTENTIAL: Good  CLINICAL DECISION MAKING: Evolving/moderate complexity  EVALUATION COMPLEXITY: Moderate   GOALS: Goals reviewed with patient? Yes  SHORT TERM GOALS: Target date: 05/14/2024 Patient will show compliance with initial HEP.  Baseline: Goal status: Met 04/24/2024  2.  Patient will report pain levels no greater than 6/10 in order to show an improved overall quality of life. Baseline:  Goal status:  MET 05/07/2024  3.  Patient will increase knee flexion ROM to at least 85deg in order to improve functional mobility. Baseline:  Goal status: MET 05/07/2024  4.  Patient will increase PSFS to at least 2.66 in order show a significant improvement in subjective disability rating. Baseline:  Goal status: Ongoing  05/07/2024   LONG TERM GOALS: Target date: 07/02/2024  Patient will show independence with final HEP in order to maintain and progress upon functional gains made within PT. Baseline:  Goal status: Ongoing  05/07/2024  2.  Patient will report pain levels no greater than 3/10 in order to show an  improved overall quality of life. Baseline:  Goal status: Ongoing  05/07/2024  3.  Patient will increase PSFS to at least 4.66 in order show a significant improvement in subjective disability rating. Baseline:  Goal status: Ongoing  05/07/2024  4.  Patient will improve knee flexion ROM to at least 110deg in order to improve functional mobility. Baseline:  Goal status: Ongoing  05/07/2024  5.  Patient will ambulate mod I with LRAD for 100' in order to return to more independence with physical activity. Baseline:  Goal status: Ongoing  05/07/2024   PLAN:  PT FREQUENCY: 2x/week  PT DURATION: 10 weeks  PLANNED INTERVENTIONS: [id number]- PT Re-evaluation, 97750- Physical Performance Testing, 97110-Therapeutic exercises, 97530- Therapeutic activity, [id number]- Neuromuscular re-education, 97535- Self Care, [id number]- Manual therapy, [id number]- Gait training, [phone]- Orthotic Initial, [phone]- Orthotic/Prosthetic subsequent, [phone]- Canalith repositioning, [phone]- Aquatic Therapy, [phone]- Electrical stimulation (unattended), [phone]- Electrical stimulation (manual), [id number]- Vasopneumatic device, [id number]- Ultrasound, [id number]- Traction (mechanical), [id number]- Ionotophoresis 4mg /ml Dexamethasone , 79439 (1-2 muscles), 20561 (3+ muscles)- Dry Needling, Patient/Family education, Balance training, Stair training, Taping, Joint mobilization, Spinal manipulation, Spinal mobilization, Scar mobilization, Vestibular training, DME instructions, Cryotherapy, and Moist heat  PLAN FOR NEXT SESSION:  try stairs with 2 rails, continue to progress knee range both flexion and extension with functional activities. Manual therapy. Vaso for edema.    Grayce Spatz, PT, DPT 05/07/2024, 3:51 PM  "

## 2024-05-10 ENCOUNTER — Ambulatory Visit: Admitting: Rehabilitative and Restorative Service Providers"

## 2024-05-10 ENCOUNTER — Encounter: Payer: Self-pay | Admitting: Rehabilitative and Restorative Service Providers"

## 2024-05-10 DIAGNOSIS — M25561 Pain in right knee: Secondary | ICD-10-CM

## 2024-05-10 DIAGNOSIS — M25661 Stiffness of right knee, not elsewhere classified: Secondary | ICD-10-CM | POA: Diagnosis not present

## 2024-05-10 DIAGNOSIS — R600 Localized edema: Secondary | ICD-10-CM | POA: Diagnosis not present

## 2024-05-10 DIAGNOSIS — R2689 Other abnormalities of gait and mobility: Secondary | ICD-10-CM | POA: Diagnosis not present

## 2024-05-10 DIAGNOSIS — M6281 Muscle weakness (generalized): Secondary | ICD-10-CM

## 2024-05-10 NOTE — Therapy (Signed)
 " OUTPATIENT PHYSICAL THERAPY LOWER EXTREMITY TREATMENT   Patient Name: [name] MRN: [id number] DOB:[birth year], 64 y.o., female Today's Date: 05/10/2024  END OF SESSION:  PT End of Session - 05/10/24 1306     Visit Number 5    Number of Visits 20    Date for Recertification  07/02/24    Authorization Type [employer] NO COPAY, DED AND OOP MET    PT Start Time 1303    PT Stop Time 1358    PT Time Calculation (min) 55 min    Activity Tolerance Patient tolerated treatment well    Behavior During Therapy WFL for tasks assessed/performed            Past Medical History:  Diagnosis Date   Abnormal Pap smear    gland. cells -> colpo -> neg endo bx, CIN I   Arthritis    Hyperlipidemia    Hypothyroidism    STD (sexually transmitted disease)    HSV   Past Surgical History:  Procedure Laterality Date   CESAREAN SECTION  1996   COLONOSCOPY     GASTRIC BYPASS  08/2003   TOTAL HIP ARTHROPLASTY Left 12/05/2023   Procedure: ARTHROPLASTY, HIP, TOTAL, ANTERIOR APPROACH;  Surgeon: Vernetta Lonni GRADE, MD;  Location: [facility] OR;  Service: Orthopedics;  Laterality: Left;   TOTAL KNEE ARTHROPLASTY Right 04/09/2024   Procedure: ARTHROPLASTY, KNEE, TOTAL;  Surgeon: Vernetta Lonni GRADE, MD;  Location: [facility] OR;  Service: Orthopedics;  Laterality: Right;   VAGINAL DELIVERY  1994   Patient Active Problem List   Diagnosis Date Noted   Status post total right knee replacement 04/09/2024   Status post total replacement of left hip 12/05/2023   Lichen sclerosus 06/04/2019   HSV (herpes simplex virus) infection 11/25/2016   Hypothyroid 11/25/2016   Thyroid  nodule 07/27/2015    PCP: Montie Pizza, MD   REFERRING PROVIDER: Lonni Vernetta, MD   REFERRING DIAG: [phone] (ICD-10-CM) - Status post right knee replacement  THERAPY DIAG:  Acute pain of right knee  Stiffness of right knee, not elsewhere classified  Muscle weakness (generalized)  Other abnormalities of gait and  mobility  Localized edema  Rationale for Evaluation and Treatment: Rehabilitation  ONSET DATE: Rt TKA 04/09/2024  SUBJECTIVE:   SUBJECTIVE STATEMENT: [name] is still having a hard time at night.  She is getting about 2 hours of sleep uninterrupted.  She sees Dr. Vernetta on [facility] day (January 19).  PERTINENT HISTORY: Patient reports that she has always gained and lost weight (up to 100 pounds) throughout her life and never quit walking for physical activity which led to degradation of the Lt hip and Rt knee. Patient underwent Lt THA in August 2025 and then underwent Rt TKA 04/09/2024. Patient notes no other surgery or trauma to joint or surrounding joints. Patient endorses sleep continuing to be impacted and is still sleeping in a recliner/lift chair. Patient notes that MD recently prescribed diclofenac .   See PMH or personal factors for in depth comorbidities    PAIN:  Are you having pain? Yes: NPRS scale: 0-8/10 (most pain at night) this week Pain location: Rt knee (mostly lateral joint line) Pain description: Tight Aggravating factors: Sleeping and end range motion Relieving factors: Meds and ice as needed  PRECAUTIONS: Fall  RED FLAGS: Bowel or bladder incontinence: Yes: not followed on this, but believes it's from the medication (muscle relaxers) and Cauda equina syndrome: No   WEIGHT BEARING RESTRICTIONS: No  FALLS:  Has patient fallen in last  6 months? No  LIVING ENVIRONMENT: Lives with: lives with their spouse Lives in: House/apartment Stairs: Yes: Internal: 20 steps; can reach both and External: 4 steps; on left going up Has following equipment at home: Single point cane, Quad cane small base, Walker - 2 wheeled, Environmental Consultant - 4 wheeled, and Crutches  OCCUPATION: work from home, [employer] inpatient coordinator   PLOF: Independent; prior to hip surgery in August she was ambulating with AD (crutches or rollator)   PATIENT GOALS: walk unassisted   NEXT MD VISIT:  05/20/2024  OBJECTIVE:  Note: Objective measures were completed at Evaluation unless otherwise noted.  DIAGNOSTIC FINDINGS:  IMPRESSION: Right knee arthroplasty without immediate postoperative complication  PATIENT SURVEYS:  PSFS: THE PATIENT SPECIFIC FUNCTIONAL SCALE  Place score of 0-10 (0 = unable to perform activity and 10 = able to perform activity at the same level as before injury or problem)  Activity Date: 04/23/2024    Walking unassisted  2    2. Stairs unassisted  0    3. Physical activity (walking, etc.)  0    4.      Total Score 0.66      Total Score = Sum of activity scores/number of activities  Minimally Detectable Change: 3 points (for single activity); 2 points (for average score)  [name] (nd). The Patient Specific Functional Scale . Retrieved from [email or web address]   COGNITION: Overall cognitive status: Within functional limits for tasks assessed     SENSATION: Subjective numbness of Rt dorsal foot and lower leg secondary to nerve block and edema   POSTURE: rounded shoulders, forward head, and increased thoracic kyphosis  PALPATION: Not TTP around joint lines   LOWER EXTREMITY ROM:  Active ROM Right Eval 04/23/2024 Right 05/01/24 Right 05/07/24 Right 05/10/2024  Hip flexion      Hip extension      Hip abduction      Hip adduction      Hip internal rotation      Hip external rotation      Knee flexion 67deg supine AROM after 3 heel slides Supine 80A  Seated AA: 100* Active supine 96   Knee extension 2deg flexion   0  Ankle dorsiflexion      Ankle plantarflexion      Ankle inversion      Ankle eversion       (Blank rows = not tested)  LOWER EXTREMITY MMT:  MMT Right Eval 04/23/2024 Left Eval 04/23/2024  Hip flexion    Hip extension    Hip abduction    Hip adduction    Hip internal rotation    Hip external rotation    Knee flexion    Knee extension    Ankle  dorsiflexion    Ankle plantarflexion    Ankle inversion    Ankle eversion     (Blank rows = not tested)  GAIT: Distance walked: not objectively measured  Assistive device utilized: Environmental Consultant - 4 wheeled Level of assistance: supervision  Comments: decreased Rt knee flexion with swing, antalgic pattern   TREATMENT DATE: R TKA 05/10/2024 Recumbent bike Seat 6 for 8 minutes (AAROM; 10 revolutions backwards and 10 forwards) T[place]gate knee flexion 1 minutes Knee flexion active-assisted range of motion 2 sets of 5 for 10 seconds Quadriceps sets 10 x 5 seconds Seated straight leg raises 3 sets of 5 for 3 seconds (emphasis on maintaining quad control, no quad lag)  Functional Activities: Double Leg Press 15 reps 50# slow eccentrics and stretch  into flexion Single Leg Press 10 reps 25# slow eccentrics and stretch into flexion  Vaso Right Knee Medium Pressure 34* 10 minutes   05/07/2024 Therapeutic Exercise: SciFit bike seat 11 for 4 min with BLEs & BUEs, then BLEs only 4 min.   Knee flexion stretch with foot on 18 chair against wall (chair back & cane support) 3 reps 10 sec hold 3 sets moving LLE stance 1-2 closer to chair.   Therapeutic Activities: PT demo & verbal cues on sit to/from 18 chair. 10 reps using armrests then 3 reps using UEs on seat / no armrests.  PT demo & verbal cues on technique to use RLE for step negotiation. BUE support with 6 box -Pt able to actively flex knee to step up, step up and step down for 10 reps. Pt improved knee control & utilization of Rt knee with repetition.    Gait Training: Pt arrived to PT amb with cane.  PT tighten lock ring on her cane for safety.  Pt amb within clinic without AD with supervision 20' - 50'.   Manual:  Soft tissue mobilization with suction cup and instrument assisted.  Contract-relax and AAROM knee flexion with overpressure / tibial internal rotation.    Vaso Right Knee Medium Pressure 34* 10 min with extension prop.                                                                                                                            05/01/24 Nustep seat 8 level 4 5 min Double Leg Press 2 sets of 15 reps with 56# full range with slow eccentrics Single Leg Press 2x10 reps with 31# full range with slow eccentrics Sit to stand from 23.5 inches 6x no UE use Seated knee flexion/heel slides 2x10 Seated SLR 2x10 Seated LAQ 2x10 Supine heel slides 15x Manual: PROM for flexion mobility Vaso Right Knee Medium Pressure 34* 10 min   PATIENT EDUCATION:  Education details: HEP, POC Person educated: Patient Education method: Explanation, Demonstration, Tactile cues, Verbal cues, and Handouts Education comprehension: verbalized understanding, returned demonstration, verbal cues required, and tactile cues required  HOME EXERCISE PROGRAM: Access Code: [facility] URL: [email or web address] Date: 05/10/2024 Prepared by: Lamar Ivory  Exercises - Supine Heel Slide with Strap  - 1 x daily - 7 x weekly - 3 sets - 10 reps - Supine Knee Extension Stretch on Towel Roll  - 1 x daily - 7 x weekly - 3 sets - 30sec hold - Quad Setting and Stretching  - 3 x daily - 7 x weekly - 2 sets - 10 reps - 30sec hold - Supine Ankle Pumps in Elevation on Pillows  - 1 x daily - 7 x weekly - 2 sets - 10 reps - Seated Knee Flexion AAROM  - 1 x daily - 7 x weekly - 2 sets - 10 reps - 10 seconds hold - Seated Active Straight-Leg Raise  - 1 x daily - 7 x weekly -  2 sets - 10 reps - 2sec hold - Seated Straight Leg Raise   - 3 x daily - 7 x weekly - 2 sets - 5 reps - 2 seconds hold  ASSESSMENT:  CLINICAL IMPRESSION:  Yavonne continues to work very hard with both her home and clinic rehabilitation.  Active range of motion today was assessed at 0 -96 degrees.  Keosha is aware that knee flexion active range of motion, quadriceps strength and edema control remain high priorities with both her home and clinic rehabilitation.  OBJECTIVE  IMPAIRMENTS: decreased activity tolerance, decreased balance, decreased coordination, difficulty walking, decreased ROM, decreased strength, increased edema, increased muscle spasms, impaired sensation, improper body mechanics, postural dysfunction, obesity, and pain.   ACTIVITY LIMITATIONS: carrying, sitting, standing, squatting, sleeping, stairs, transfers, and bed mobility  PARTICIPATION LIMITATIONS: cleaning, driving, shopping, community activity, and occupation  PERSONAL FACTORS: Fitness, Past/current experiences, Time since onset of injury/illness/exacerbation, and 3+ comorbidities: arthritis, HLD, hypothyroidism, body habitus are also affecting patient's functional outcome.   REHAB POTENTIAL: Good  CLINICAL DECISION MAKING: Evolving/moderate complexity  EVALUATION COMPLEXITY: Moderate   GOALS: Goals reviewed with patient? Yes  SHORT TERM GOALS: Target date: 05/14/2024 Patient will show compliance with initial HEP.  Baseline: Goal status: Met 04/24/2024  2.  Patient will report pain levels no greater than 6/10 in order to show an improved overall quality of life. Baseline:  Goal status: Ongoing 05/10/2024  3.  Patient will increase knee flexion ROM to at least 85deg in order to improve functional mobility. Baseline:  Goal status: MET 05/07/2024  4.  Patient will increase PSFS to at least 2.66 in order show a significant improvement in subjective disability rating. Baseline:  Goal status: Ongoing  05/07/2024   LONG TERM GOALS: Target date: 07/02/2024  Patient will show independence with final HEP in order to maintain and progress upon functional gains made within PT. Baseline:  Goal status: Ongoing  05/07/2024  2.  Patient will report pain levels no greater than 3/10 in order to show an improved overall quality of life. Baseline:  Goal status: Ongoing  05/07/2024  3.  Patient will increase PSFS to at least 4.66 in order show a significant improvement in subjective disability  rating. Baseline:  Goal status: Ongoing  05/07/2024  4.  Patient will improve knee flexion ROM to at least 110deg in order to improve functional mobility. Baseline:  Goal status: Ongoing  05/07/2024  5.  Patient will ambulate mod I with LRAD for 100' in order to return to more independence with physical activity. Baseline:  Goal status: Ongoing  05/07/2024   PLAN:  PT FREQUENCY: 2x/week  PT DURATION: 10 weeks  PLANNED INTERVENTIONS: [id number]- PT Re-evaluation, 97750- Physical Performance Testing, 97110-Therapeutic exercises, 97530- Therapeutic activity, [id number]- Neuromuscular re-education, 97535- Self Care, [id number]- Manual therapy, [id number]- Gait training, [phone]- Orthotic Initial, [phone]- Orthotic/Prosthetic subsequent, [phone]- Canalith repositioning, [phone]- Aquatic Therapy, [phone]- Electrical stimulation (unattended), [phone]- Electrical stimulation (manual), [id number]- Vasopneumatic device, [id number]- Ultrasound, [id number]- Traction (mechanical), [id number]- Ionotophoresis 4mg /ml Dexamethasone , 79439 (1-2 muscles), [id number] (3+ muscles)- Dry Needling, Patient/Family education, Balance training, Stair training, Taping, Joint mobilization, Spinal manipulation, Spinal mobilization, Scar mobilization, Vestibular training, DME instructions, Cryotherapy, and Moist heat  PLAN FOR NEXT SESSION: Try stairs with 2 rails, continue to progress knee range (flexion emphasis) and quadriceps strength with functional activities.  Manual therapy.  Vaso for edema.    Myer LELON Ivory, PT, MPT 05/10/2024, 4:49 PM  "

## 2024-05-13 NOTE — Therapy (Signed)
 " OUTPATIENT PHYSICAL THERAPY LOWER EXTREMITY TREATMENT   Patient Name: [name] MRN: [id number] DOB:1960-07-28, 64 y.o., female Today's Date: 05/14/2024  END OF SESSION:  PT End of Session - 05/14/24 1532     Visit Number 6    Number of Visits 20    Date for Recertification  07/02/24    Authorization Type [employer] NO COPAY, DED AND OOP MET    PT Start Time 1430    PT Stop Time 1518    PT Time Calculation (min) 48 min    Activity Tolerance Patient tolerated treatment well    Behavior During Therapy WFL for tasks assessed/performed             Past Medical History:  Diagnosis Date   Abnormal Pap smear    gland. cells -> colpo -> neg endo bx, CIN I   Arthritis    Hyperlipidemia    Hypothyroidism    STD (sexually transmitted disease)    HSV   Past Surgical History:  Procedure Laterality Date   CESAREAN SECTION  1996   COLONOSCOPY     GASTRIC BYPASS  08/2003   TOTAL HIP ARTHROPLASTY Left 12/05/2023   Procedure: ARTHROPLASTY, HIP, TOTAL, ANTERIOR APPROACH;  Surgeon: Vernetta Lonni GRADE, MD;  Location: [facility] OR;  Service: Orthopedics;  Laterality: Left;   TOTAL KNEE ARTHROPLASTY Right 04/09/2024   Procedure: ARTHROPLASTY, KNEE, TOTAL;  Surgeon: Vernetta Lonni GRADE, MD;  Location: [facility] OR;  Service: Orthopedics;  Laterality: Right;   VAGINAL DELIVERY  1994   Patient Active Problem List   Diagnosis Date Noted   Status post total right knee replacement 04/09/2024   Status post total replacement of left hip 12/05/2023   Lichen sclerosus 06/04/2019   HSV (herpes simplex virus) infection 11/25/2016   Hypothyroid 11/25/2016   Thyroid  nodule 07/27/2015    PCP: Montie Pizza, MD   REFERRING PROVIDER: Lonni Vernetta, MD   REFERRING DIAG: [phone] (ICD-10-CM) - Status post right knee replacement  THERAPY DIAG:  Acute pain of right knee  Stiffness of right knee, not elsewhere classified  Muscle weakness (generalized)  Other abnormalities of gait and  mobility  Localized edema  Rationale for Evaluation and Treatment: Rehabilitation  ONSET DATE: Rt TKA 04/09/2024  SUBJECTIVE:   SUBJECTIVE STATEMENT: [name] is still having a hard time at night.  A little less painthan last  week.  She is getting about 2 hours of sleep uninterrupted.  She sees Dr. Vernetta on [facility] day (January 19).  PERTINENT HISTORY: Patient reports that she has always gained and lost weight (up to 100 pounds) throughout her life and never quit walking for physical activity which led to degradation of the Lt hip and Rt knee. Patient underwent Lt THA in August 2025 and then underwent Rt TKA 04/09/2024. Patient notes no other surgery or trauma to joint or surrounding joints. Patient endorses sleep continuing to be impacted and is still sleeping in a recliner/lift chair. Patient notes that MD recently prescribed diclofenac .   See PMH or personal factors for in depth comorbidities    PAIN:  Are you having pain? Yes: NPRS scale: 0-6/10 (most pain at night)  Pain location: Rt knee (mostly lateral joint line) Pain description: Tight Aggravating factors: Sleeping and end range motion Relieving factors: Meds and ice as needed  PRECAUTIONS: Fall  RED FLAGS: Bowel or bladder incontinence: Yes: not followed on this, but believes it's from the medication (muscle relaxers) and Cauda equina syndrome: No   WEIGHT BEARING RESTRICTIONS: No  FALLS:  Has patient fallen in last 6 months? No  LIVING ENVIRONMENT: Lives with: lives with their spouse Lives in: House/apartment Stairs: Yes: Internal: 20 steps; can reach both and External: 4 steps; on left going up Has following equipment at home: Single point cane, Quad cane small base, Walker - 2 wheeled, Environmental Consultant - 4 wheeled, and Crutches  OCCUPATION: work from home, [employer] inpatient coordinator   PLOF: Independent; prior to hip surgery in August she was ambulating with AD (crutches or rollator)   PATIENT GOALS: walk unassisted    NEXT MD VISIT: 05/20/2024  OBJECTIVE:  Note: Objective measures were completed at Evaluation unless otherwise noted.  DIAGNOSTIC FINDINGS:  IMPRESSION: Right knee arthroplasty without immediate postoperative complication  PATIENT SURVEYS:  PSFS: THE PATIENT SPECIFIC FUNCTIONAL SCALE  Place score of 0-10 (0 = unable to perform activity and 10 = able to perform activity at the same level as before injury or problem)  Activity Date: 04/23/2024    Walking unassisted  2    2. Stairs unassisted  0    3. Physical activity (walking, etc.)  0    4.      Total Score 0.66      Total Score = Sum of activity scores/number of activities  Minimally Detectable Change: 3 points (for single activity); 2 points (for average score)  [name] (nd). The Patient Specific Functional Scale . Retrieved from [email or web address]   COGNITION: Overall cognitive status: Within functional limits for tasks assessed     SENSATION: Subjective numbness of Rt dorsal foot and lower leg secondary to nerve block and edema   POSTURE: rounded shoulders, forward head, and increased thoracic kyphosis  PALPATION: Not TTP around joint lines   LOWER EXTREMITY ROM:  Active ROM Right Eval 04/23/2024 Right 05/01/24 Right 05/07/24 Right 05/10/2024 Right 05/14/24  Hip flexion       Hip extension       Hip abduction       Hip adduction       Hip internal rotation       Hip external rotation       Knee flexion 67deg supine AROM after 3 heel slides Supine 80A  Seated AA: 100* Active supine 96  Active supine 98  Knee extension 2deg flexion   0   Ankle dorsiflexion       Ankle plantarflexion       Ankle inversion       Ankle eversion        (Blank rows = not tested)  LOWER EXTREMITY MMT:  MMT Right Eval 04/23/2024 Left Eval 04/23/2024  Hip flexion    Hip extension    Hip abduction    Hip adduction    Hip internal rotation    Hip  external rotation    Knee flexion    Knee extension    Ankle dorsiflexion    Ankle plantarflexion    Ankle inversion    Ankle eversion     (Blank rows = not tested)  GAIT: Distance walked: not objectively measured  Assistive device utilized: Environmental Consultant - 4 wheeled Level of assistance: supervision  Comments: decreased Rt knee flexion with swing, antalgic pattern   TREATMENT DATE: R TKA 05/14/24*** Rec bike full revolutions backwards for 3 min then forwards  TKE with TB 2x10  10x R foot taps onto 6 step to promote hip flexion  6 step 10x2 UUDD R leading only  1 set with both hands on handrails, 1 set L hand  on handrail Functional Activities: Double Leg Press 3x10 reps 62# slow eccentrics and stretch into flexion Single Leg Press 15 reps 31# slow eccentrics and stretch into flexion  05/10/2024 Recumbent bike Seat 6 for 8 minutes (AAROM; 10 revolutions backwards and 10 forwards) Tailgate knee flexion 1 minutes Knee flexion active-assisted range of motion 2 sets of 5 for 10 seconds Quadriceps sets 10 x 5 seconds Seated straight leg raises 3 sets of 5 for 3 seconds (emphasis on maintaining quad control, no quad lag)  Functional Activities: Double Leg Press 15 reps 50# slow eccentrics and stretch into flexion Single Leg Press 10 reps 25# slow eccentrics and stretch into flexion  Vaso Right Knee Medium Pressure 34* 10 minutes   05/07/2024 Therapeutic Exercise: SciFit bike seat 11 for 4 min with BLEs & BUEs, then BLEs only 4 min.   Knee flexion stretch with foot on 18 chair against wall (chair back & cane support) 3 reps 10 sec hold 3 sets moving LLE stance 1-2 closer to chair.   Therapeutic Activities: PT demo & verbal cues on sit to/from 18 chair. 10 reps using armrests then 3 reps using UEs on seat / no armrests.  PT demo & verbal cues on technique to use RLE for step negotiation. BUE support with 6 box -Pt able to actively flex knee to step up, step up and step down for 10  reps. Pt improved knee control & utilization of Rt knee with repetition.    Gait Training: Pt arrived to PT amb with cane.  PT tighten lock ring on her cane for safety.  Pt amb within clinic without AD with supervision 20' - 50'.   Manual:  Soft tissue mobilization with suction cup and instrument assisted.  Contract-relax and AAROM knee flexion with overpressure / tibial internal rotation.    Vaso Right Knee Medium Pressure 34* 10 min with extension prop.     PATIENT EDUCATION:  Education details: HEP, POC Person educated: Patient Education method: Explanation, Demonstration, Tactile cues, Verbal cues, and Handouts Education comprehension: verbalized understanding, returned demonstration, verbal cues required, and tactile cues required  HOME EXERCISE PROGRAM: Access Code: [facility] URL: [email or web address] Date: 05/10/2024 Prepared by: Lamar Ivory  Exercises - Supine Heel Slide with Strap  - 1 x daily - 7 x weekly - 3 sets - 10 reps - Supine Knee Extension Stretch on Towel Roll  - 1 x daily - 7 x weekly - 3 sets - 30sec hold - Quad Setting and Stretching  - 3 x daily - 7 x weekly - 2 sets - 10 reps - 30sec hold - Supine Ankle Pumps in Elevation on Pillows  - 1 x daily - 7 x weekly - 2 sets - 10 reps - Seated Knee Flexion AAROM  - 1 x daily - 7 x weekly - 2 sets - 10 reps - 10 seconds hold - Seated Active Straight-Leg Raise  - 1 x daily - 7 x weekly - 2 sets - 10 reps - 2sec hold - Seated Straight Leg Raise   - 3 x daily - 7 x weekly - 2 sets - 5 reps - 2 seconds hold  ASSESSMENT:  CLINICAL IMPRESSION: *** [name] continues to work very hard with both her home and clinic rehabilitation.  Active range of motion today was assessed at 0 -96 degrees.  [name] is aware that knee flexion active range of motion, quadriceps strength and edema control remain high priorities with both her home and clinic rehabilitation.  OBJECTIVE IMPAIRMENTS:  decreased activity  tolerance, decreased balance, decreased coordination, difficulty walking, decreased ROM, decreased strength, increased edema, increased muscle spasms, impaired sensation, improper body mechanics, postural dysfunction, obesity, and pain.   ACTIVITY LIMITATIONS: carrying, sitting, standing, squatting, sleeping, stairs, transfers, and bed mobility  PARTICIPATION LIMITATIONS: cleaning, driving, shopping, community activity, and occupation  PERSONAL FACTORS: Fitness, Past/current experiences, Time since onset of injury/illness/exacerbation, and 3+ comorbidities: arthritis, HLD, hypothyroidism, body habitus are also affecting patient's functional outcome.   REHAB POTENTIAL: Good  CLINICAL DECISION MAKING: Evolving/moderate complexity  EVALUATION COMPLEXITY: Moderate   GOALS: Goals reviewed with patient? Yes  SHORT TERM GOALS: Target date: 05/14/2024*** Patient will show compliance with initial HEP.  Baseline: Goal status: Met 04/24/2024  2.  Patient will report pain levels no greater than 6/10 in order to show an improved overall quality of life. Baseline:  Goal status: Ongoing 05/10/2024  3.  Patient will increase knee flexion ROM to at least 85deg in order to improve functional mobility. Baseline:  Goal status: MET 05/07/2024  4.  Patient will increase PSFS to at least 2.66 in order show a significant improvement in subjective disability rating. Baseline:  Goal status: Ongoing  05/07/2024   LONG TERM GOALS: Target date: 07/02/2024  Patient will show independence with final HEP in order to maintain and progress upon functional gains made within PT. Baseline:  Goal status: Ongoing  05/07/2024  2.  Patient will report pain levels no greater than 3/10 in order to show an improved overall quality of life. Baseline:  Goal status: Ongoing  05/07/2024  3.  Patient will increase PSFS to at least 4.66 in order show a significant improvement in subjective disability rating. Baseline:  Goal  status: Ongoing  05/07/2024  4.  Patient will improve knee flexion ROM to at least 110deg in order to improve functional mobility. Baseline:  Goal status: Ongoing  05/07/2024  5.  Patient will ambulate mod I with LRAD for 100' in order to return to more independence with physical activity. Baseline:  Goal status: Ongoing  05/07/2024   PLAN:  PT FREQUENCY: 2x/week  PT DURATION: 10 weeks  PLANNED INTERVENTIONS: 97164- PT Re-evaluation, 97750- Physical Performance Testing, 97110-Therapeutic exercises, 97530- Therapeutic activity, [id number]- Neuromuscular re-education, 97535- Self Care, [id number]- Manual therapy, [id number]- Gait training, [phone]- Orthotic Initial, [id number]- Orthotic/Prosthetic subsequent, [phone]- Canalith repositioning, [phone]- Aquatic Therapy, [phone]- Electrical stimulation (unattended), [phone]- Electrical stimulation (manual), [id number]- Vasopneumatic device, [id number]- Ultrasound, [id number]- Traction (mechanical), [id number]- Ionotophoresis 4mg /ml Dexamethasone , 79439 (1-2 muscles), [id number] (3+ muscles)- Dry Needling, Patient/Family education, Balance training, Stair training, Taping, Joint mobilization, Spinal manipulation, Spinal mobilization, Scar mobilization, Vestibular training, DME instructions, Cryotherapy, and Moist heat  PLAN FOR NEXT SESSION: T***ry stairs with 2 rails, continue to progress knee range (flexion emphasis) and quadriceps strength with functional activities.  Manual therapy.  Vaso for edema.    Burnard CHRISTELLA Meth, PT, MPT 05/14/2024, 3:33 PM  "

## 2024-05-14 ENCOUNTER — Ambulatory Visit

## 2024-05-14 DIAGNOSIS — M6281 Muscle weakness (generalized): Secondary | ICD-10-CM

## 2024-05-14 DIAGNOSIS — M25561 Pain in right knee: Secondary | ICD-10-CM | POA: Diagnosis not present

## 2024-05-14 DIAGNOSIS — R2689 Other abnormalities of gait and mobility: Secondary | ICD-10-CM

## 2024-05-14 DIAGNOSIS — M25661 Stiffness of right knee, not elsewhere classified: Secondary | ICD-10-CM | POA: Diagnosis not present

## 2024-05-14 DIAGNOSIS — R600 Localized edema: Secondary | ICD-10-CM | POA: Diagnosis not present

## 2024-05-17 ENCOUNTER — Ambulatory Visit: Admitting: Rehabilitative and Restorative Service Providers"

## 2024-05-17 ENCOUNTER — Encounter: Payer: Self-pay | Admitting: Rehabilitative and Restorative Service Providers"

## 2024-05-17 DIAGNOSIS — M6281 Muscle weakness (generalized): Secondary | ICD-10-CM

## 2024-05-17 DIAGNOSIS — M25661 Stiffness of right knee, not elsewhere classified: Secondary | ICD-10-CM

## 2024-05-17 DIAGNOSIS — R600 Localized edema: Secondary | ICD-10-CM

## 2024-05-17 DIAGNOSIS — M25561 Pain in right knee: Secondary | ICD-10-CM

## 2024-05-17 DIAGNOSIS — R2689 Other abnormalities of gait and mobility: Secondary | ICD-10-CM | POA: Diagnosis not present

## 2024-05-17 NOTE — Therapy (Signed)
 " OUTPATIENT PHYSICAL THERAPY LOWER EXTREMITY TREATMENT/PROGRESS NOTE  Progress Note Reporting Period 04/23/2024 to 05/17/2024  See note below for Objective Data and Assessment of Progress/Goals.     Patient Name: [name] MRN: [id number] DOB:11/29/1960, 64 y.o., female Today's Date: 05/17/2024  END OF SESSION:  PT End of Session - 05/17/24 1346     Visit Number 7    Number of Visits 20    Date for Recertification  07/02/24    Authorization Type [employer] NO COPAY, DED AND OOP MET    PT Start Time 1343    PT Stop Time 1438    PT Time Calculation (min) 55 min    Activity Tolerance Patient tolerated treatment well;No increased pain;Patient limited by pain    Behavior During Therapy [facility] for tasks assessed/performed              Past Medical History:  Diagnosis Date   Abnormal Pap smear    gland. cells -> colpo -> neg endo bx, CIN I   Arthritis    Hyperlipidemia    Hypothyroidism    STD (sexually transmitted disease)    HSV   Past Surgical History:  Procedure Laterality Date   CESAREAN SECTION  1996   COLONOSCOPY     GASTRIC BYPASS  08/2003   TOTAL HIP ARTHROPLASTY Left 12/05/2023   Procedure: ARTHROPLASTY, HIP, TOTAL, ANTERIOR APPROACH;  Surgeon: Vernetta Lonni GRADE, MD;  Location: [facility] OR;  Service: Orthopedics;  Laterality: Left;   TOTAL KNEE ARTHROPLASTY Right 04/09/2024   Procedure: ARTHROPLASTY, KNEE, TOTAL;  Surgeon: Vernetta Lonni GRADE, MD;  Location: [facility] OR;  Service: Orthopedics;  Laterality: Right;   VAGINAL DELIVERY  1994   Patient Active Problem List   Diagnosis Date Noted   Status post total right knee replacement 04/09/2024   Status post total replacement of left hip 12/05/2023   Lichen sclerosus 06/04/2019   HSV (herpes simplex virus) infection 11/25/2016   Hypothyroid 11/25/2016   Thyroid  nodule 07/27/2015    PCP: Montie Pizza, MD   REFERRING PROVIDER: Lonni Vernetta, MD   REFERRING DIAG: [phone] (ICD-10-CM) - Status post right  knee replacement  THERAPY DIAG:  Acute pain of right knee  Stiffness of right knee, not elsewhere classified  Muscle weakness (generalized)  Other abnormalities of gait and mobility  Localized edema  Rationale for Evaluation and Treatment: Rehabilitation  ONSET DATE: Rt TKA 04/09/2024  SUBJECTIVE:   SUBJECTIVE STATEMENT: Kindel has a hard time sleeping at night, particularly with the cold.  She is doing a good job with her home exercises.  PERTINENT HISTORY: Patient reports that she has always gained and lost weight (up to 100 pounds) throughout her life and never quit walking for physical activity which led to degradation of the Lt hip and Rt knee. Patient underwent Lt THA in August 2025 and then underwent Rt TKA 04/09/2024. Patient notes no other surgery or trauma to joint or surrounding joints. Patient endorses sleep continuing to be impacted and is still sleeping in a recliner/lift chair. Patient notes that MD recently prescribed diclofenac .   See PMH or personal factors for in depth comorbidities    PAIN:  Are you having pain? Yes: NPRS scale: 0-6/10 (most pain with exercises, typically 0-3/10 excluding exercises)  Pain location: Rt knee (mostly lateral joint line) Pain description: Tight Aggravating factors: Sleeping and end range motion Relieving factors: Meds and ice as needed  PRECAUTIONS: Fall  RED FLAGS: Bowel or bladder incontinence: Yes: not followed on this, but  believes it's from the medication (muscle relaxers) and Cauda equina syndrome: No   WEIGHT BEARING RESTRICTIONS: No  FALLS:  Has patient fallen in last 6 months? No  LIVING ENVIRONMENT: Lives with: lives with their spouse Lives in: House/apartment Stairs: Yes: Internal: 20 steps; can reach both and External: 4 steps; on left going up Has following equipment at home: Single point cane, Quad cane small base, Walker - 2 wheeled, Environmental Consultant - 4 wheeled, and Crutches  OCCUPATION: work from home, [employer]  inpatient coordinator   PLOF: Independent; prior to hip surgery in August she was ambulating with AD (crutches or rollator)   PATIENT GOALS: walk unassisted   NEXT MD VISIT: 05/20/2024  OBJECTIVE:  Note: Objective measures were completed at Evaluation unless otherwise noted.  DIAGNOSTIC FINDINGS:  IMPRESSION: Right knee arthroplasty without immediate postoperative complication  PATIENT SURVEYS:  PSFS: THE PATIENT SPECIFIC FUNCTIONAL SCALE  Place score of 0-10 (0 = unable to perform activity and 10 = able to perform activity at the same level as before injury or problem)  Activity Date: 04/23/2024 05/18/2023   Walking unassisted  2 6.5   2. Stairs unassisted  0 0   3. Physical activity (walking, etc.)  0 6.5   4.      Total Score 0.66 4.33     Total Score = Sum of activity scores/number of activities  Minimally Detectable Change: 3 points (for single activity); 2 points (for average score)  [employer] [facility] (nd). The Patient Specific Functional Scale . Retrieved from [email or web address]   COGNITION: Overall cognitive status: Within functional limits for tasks assessed     SENSATION: Subjective numbness of Rt dorsal foot and lower leg secondary to nerve block and edema   POSTURE: rounded shoulders, forward head, and increased thoracic kyphosis  PALPATION: Not TTP around joint lines   LOWER EXTREMITY ROM:  Active ROM Right Eval 04/23/2024 Right 05/01/24 Right 05/07/24 Right 05/10/2024 Right 05/14/24 Right 05/17/2024  Hip flexion        Hip extension        Hip abduction        Hip adduction        Hip internal rotation        Hip external rotation        Knee flexion 67deg supine AROM after 3 heel slides Supine 80A  Seated AA: 100* Active supine 96  Active supine 98 Active supine 110  Knee extension 2deg flexion   0  0  Ankle dorsiflexion        Ankle plantarflexion        Ankle inversion        Ankle  eversion         (Blank rows = not tested)  LOWER EXTREMITY STRENGTH:  MMT Left/Right  05/17/2024   Hip flexion    Hip extension    Hip abduction    Hip adduction    Hip internal rotation    Hip external rotation    Knee flexion    Knee extension 57.2/28.9   Ankle dorsiflexion    Ankle plantarflexion    Ankle inversion    Ankle eversion     (Blank rows = not tested)  GAIT: Distance walked: not objectively measured  Assistive device utilized: Environmental Consultant - 4 wheeled Level of assistance: supervision  Comments: decreased Rt knee flexion with swing, antalgic pattern   TREATMENT DATE: R TKA 05/17/2024 Recumbent bike Seat 6 for 5 minutes AAROM to 10 pedals backwards to pedaling  forwards Resistance level 5-7 Tailgate knee flexion 1 minutes Knee flexion active-assisted range of motion 5 for 10 seconds Seated straight leg raises 5 for 3 seconds (emphasis on maintaining quad control, no quad lag)  Functional Activities: Double Leg Press 15 reps 62# and 15 reps at 68# slow eccentrics and stretch into flexion Single Leg Press 10 reps 37# and 10 reps 43# slow eccentrics and stretch into flexion  Vaso Right Knee Medium Pressure 34* 10 minutes   05/14/24 Rec bike full revolutions backwards for 3 min then forwards  TKE with TB 2x10  10x R foot taps onto 6 step to promote hip flexion  6 step 10x2 UUDD R leading only  1 set with both hands on handrails, 1 set L hand on handrail Functional Activities: Double Leg Press 3x10 reps 62# slow eccentrics and stretch into flexion Single Leg Press 15 reps 31# slow eccentrics and stretch into flexion   05/10/2024 Recumbent bike Seat 6 for 8 minutes (AAROM; 10 revolutions backwards and 10 forwards) Tailgate knee flexion 1 minutes Knee flexion active-assisted range of motion 2 sets of 5 for 10 seconds Quadriceps sets 10 x 5 seconds Seated straight leg raises 3 sets of 5 for 3 seconds (emphasis on maintaining quad control, no quad  lag)  Functional Activities: Double Leg Press 15 reps 50# slow eccentrics and stretch into flexion Single Leg Press 10 reps 25# slow eccentrics and stretch into flexion  Vaso Right Knee Medium Pressure 34* 10 minutes   PATIENT EDUCATION:  Education details: HEP, POC Person educated: Patient Education method: Explanation, Demonstration, Tactile cues, Verbal cues, and Handouts Education comprehension: verbalized understanding, returned demonstration, verbal cues required, and tactile cues required  HOME EXERCISE PROGRAM: Access Code: [facility] URL: [email or web address] Date: 05/10/2024 Prepared by: Lamar Ivory  Exercises - Supine Heel Slide with Strap  - 1 x daily - 7 x weekly - 3 sets - 10 reps - Supine Knee Extension Stretch on Towel Roll  - 1 x daily - 7 x weekly - 3 sets - 30sec hold - Quad Setting and Stretching  - 3 x daily - 7 x weekly - 2 sets - 10 reps - 30sec hold - Supine Ankle Pumps in Elevation on Pillows  - 1 x daily - 7 x weekly - 2 sets - 10 reps - Seated Knee Flexion AAROM  - 1 x daily - 7 x weekly - 2 sets - 10 reps - 10 seconds hold - Seated Active Straight-Leg Raise  - 1 x daily - 7 x weekly - 2 sets - 10 reps - 2sec hold - Seated Straight Leg Raise   - 3 x daily - 7 x weekly - 2 sets - 5 reps - 2 seconds hold  ASSESSMENT:  CLINICAL IMPRESSION:  Aide is doing a great job early with her post-surgical physical therapy.  Active range of motion is 0 to 110 degrees and her right quadriceps strength is approximately 50% of the uninvolved left.  Denajah will benefit from continued strength, balance and functional work as both legs are significantly weak as 80+ pounds of quadriceps strength is expected for this patient.  I anticipate she will meet long-term goals by the end of February.   OBJECTIVE IMPAIRMENTS: decreased activity tolerance, decreased balance, decreased coordination, difficulty walking, decreased ROM, decreased strength, increased  edema, increased muscle spasms, impaired sensation, improper body mechanics, postural dysfunction, obesity, and pain.   ACTIVITY LIMITATIONS: carrying, sitting, standing, squatting, sleeping, stairs, transfers, and bed mobility  PARTICIPATION LIMITATIONS:  cleaning, driving, shopping, community activity, and occupation  PERSONAL FACTORS: Fitness, Past/current experiences, Time since onset of injury/illness/exacerbation, and 3+ comorbidities: arthritis, HLD, hypothyroidism, body habitus are also affecting patient's functional outcome.   REHAB POTENTIAL: Good  CLINICAL DECISION MAKING: Evolving/moderate complexity  EVALUATION COMPLEXITY: Moderate   GOALS: Goals reviewed with patient? Yes  SHORT TERM GOALS: Target date: 05/14/2024 Patient will show compliance with initial HEP.  Baseline: Goal status: Met 04/24/2024  2.  Patient will report pain levels no greater than 6/10 in order to show an improved overall quality of life. Baseline:  Goal status: MET 05/14/24  3.  Patient will increase knee flexion ROM to at least 85deg in order to improve functional mobility. Baseline:  Goal status: MET 05/07/2024  4.  Patient will increase PSFS to at least 2.66 in order show a significant improvement in subjective disability rating. Baseline:  Goal status: Met 05/17/2024   LONG TERM GOALS: Target date: 07/02/2024  Patient will show independence with final HEP in order to maintain and progress upon functional gains made within PT. Baseline:  Goal status: Ongoing  05/17/2024  2.  Patient will report pain levels no greater than 3/10 in order to show an improved overall quality of life. Baseline:  Goal status: Ongoing  05/17/2024  3.  Patient will increase PSFS to at least 4.66 in order show a significant improvement in subjective disability rating. Baseline:  Goal status: Ongoing  05/17/2024  4.  Patient will improve knee flexion ROM to at least 110deg in order to improve functional  mobility. Baseline:  Goal status: Met 05/17/2024  5.  Patient will ambulate mod I with LRAD for 100' in order to return to more independence with physical activity. Baseline:  Goal status: Ongoing  05/17/2024   PLAN:  PT FREQUENCY: 2x/week  PT DURATION: 10 weeks  PLANNED INTERVENTIONS: 97164- PT Re-evaluation, 97750- Physical Performance Testing, 97110-Therapeutic exercises, 97530- Therapeutic activity, [id number]- Neuromuscular re-education, 97535- Self Care, [id number]- Manual therapy, [id number]- Gait training, [phone]- Orthotic Initial, [phone]- Orthotic/Prosthetic subsequent, [phone]- Canalith repositioning, [phone]- Aquatic Therapy, [phone]- Electrical stimulation (unattended), [phone]- Electrical stimulation (manual), [id number]- Vasopneumatic device, [id number]- Ultrasound, [id number]- Traction (mechanical), [id number]- Ionotophoresis 4mg /ml Dexamethasone , 79439 (1-2 muscles), 20561 (3+ muscles)- Dry Needling, Patient/Family education, Balance training, Stair training, Taping, Joint mobilization, Spinal manipulation, Spinal mobilization, Scar mobilization, Vestibular training, DME instructions, Cryotherapy, and Moist heat  PLAN FOR NEXT SESSION: Try stairs with 2 rails, continue to progress knee range (flexion emphasis) and quadriceps strength with functional activities.  Balance and more functional progressions as appropriate.  Myer LELON Ivory, PT, MPT 05/17/2024, 4:36 PM  "

## 2024-05-20 ENCOUNTER — Encounter

## 2024-05-20 ENCOUNTER — Ambulatory Visit: Admitting: Orthopaedic Surgery

## 2024-05-20 ENCOUNTER — Other Ambulatory Visit (INDEPENDENT_AMBULATORY_CARE_PROVIDER_SITE_OTHER)

## 2024-05-20 ENCOUNTER — Encounter: Payer: Self-pay | Admitting: Orthopaedic Surgery

## 2024-05-20 DIAGNOSIS — Z96651 Presence of right artificial knee joint: Secondary | ICD-10-CM

## 2024-05-20 NOTE — Progress Notes (Signed)
 The patient is a very pleasant 64 year old female who is about just over 6 weeks status post a right total knee replacement.  We replaced her left hip last year as well.  She reports that she is doing great overall.  She says she feels like she can even stop physical therapy.  She is walking without an assist device.  Her left hip is moving smoothly and fluidly.  The right operative knee has excellent range of motion and feel stable on exam.  She has someone who we were concerned about due to the large soft tissue envelope around her right knee.  She is very pleased as our way.  2 views of the right knee show well-seated total knee arthroplasty with no complicating features.  From my standpoint I agree that she can stop outpatient therapy since she has made such such progress and is ahead of a lot of people.  She still needs to be very careful with her joints.  From our standpoint we will see her back in 6 months and at that visit we will have a standing AP pelvis and an AP and lateral of her right knee.

## 2024-05-23 ENCOUNTER — Encounter

## 2024-05-28 ENCOUNTER — Encounter: Admitting: Physical Therapy

## 2024-05-31 ENCOUNTER — Encounter: Admitting: Rehabilitative and Restorative Service Providers"

## 2024-06-04 ENCOUNTER — Encounter

## 2024-06-06 ENCOUNTER — Encounter: Admitting: Rehabilitative and Restorative Service Providers"

## 2024-11-18 ENCOUNTER — Ambulatory Visit: Admitting: Orthopaedic Surgery
